# Patient Record
Sex: Male | Born: 1960 | Race: Black or African American | Hispanic: No | Marital: Married | Smoking: Former smoker
Health system: Southern US, Community
[De-identification: ages and names within clinical notes are randomized; demographics above are authoritative.]

## PROBLEM LIST (undated history)

## (undated) ENCOUNTER — Emergency Department (HOSPITAL_COMMUNITY): Discharge status: Absent without leave | Payer: Medicaid - Out of State

## (undated) DIAGNOSIS — I10 Essential (primary) hypertension: Secondary | ICD-10-CM

## (undated) DIAGNOSIS — G8929 Other chronic pain: Secondary | ICD-10-CM

## (undated) DIAGNOSIS — E119 Type 2 diabetes mellitus without complications: Secondary | ICD-10-CM

## (undated) DIAGNOSIS — M549 Dorsalgia, unspecified: Secondary | ICD-10-CM

## (undated) HISTORY — PX: UMBILICAL HERNIA REPAIR: SHX196

## (undated) HISTORY — PX: BACK SURGERY: SHX140

---

## 2012-02-20 ENCOUNTER — Emergency Department (HOSPITAL_COMMUNITY)
Admission: EM | Admit: 2012-02-20 | Discharge: 2012-02-21 | Disposition: A | Discharge status: Home / Self Care | Payer: Self-pay | Attending: Emergency Medicine | Admitting: Emergency Medicine

## 2012-02-20 ENCOUNTER — Emergency Department (HOSPITAL_COMMUNITY): Payer: Self-pay

## 2012-02-20 ENCOUNTER — Encounter (HOSPITAL_COMMUNITY): Payer: Self-pay | Admitting: Emergency Medicine

## 2012-02-20 DIAGNOSIS — M545 Low back pain, unspecified: Secondary | ICD-10-CM | POA: Insufficient documentation

## 2012-02-20 DIAGNOSIS — Z87891 Personal history of nicotine dependence: Secondary | ICD-10-CM | POA: Insufficient documentation

## 2012-02-20 DIAGNOSIS — R296 Repeated falls: Secondary | ICD-10-CM | POA: Insufficient documentation

## 2012-02-20 DIAGNOSIS — Y929 Unspecified place or not applicable: Secondary | ICD-10-CM | POA: Insufficient documentation

## 2012-02-20 DIAGNOSIS — S60229A Contusion of unspecified hand, initial encounter: Secondary | ICD-10-CM | POA: Insufficient documentation

## 2012-02-20 DIAGNOSIS — Y9389 Activity, other specified: Secondary | ICD-10-CM | POA: Insufficient documentation

## 2012-02-20 DIAGNOSIS — G8929 Other chronic pain: Secondary | ICD-10-CM | POA: Insufficient documentation

## 2012-02-20 HISTORY — DX: Other chronic pain: G89.29

## 2012-02-20 HISTORY — DX: Dorsalgia, unspecified: M54.9

## 2012-02-20 MED ORDER — CYCLOBENZAPRINE HCL 10 MG PO TABS
5.0000 mg | ORAL_TABLET | Freq: Once | ORAL | Status: AC
Start: 2012-02-20 — End: 2012-02-20
  Administered 2012-02-20: via ORAL
  Filled 2012-02-20: qty 1

## 2012-02-20 MED ORDER — HYDROCODONE-ACETAMINOPHEN 5-325 MG PO TABS
1.0000 | ORAL_TABLET | Freq: Once | ORAL | Status: AC
  Administered 2012-02-20: 1 via ORAL
  Filled 2012-02-20: qty 1

## 2012-02-20 MED ORDER — CYCLOBENZAPRINE HCL 5 MG PO TABS: 5.0000 mg | ORAL_TABLET | Freq: Once | ORAL | Status: DC

## 2012-02-20 MED ORDER — HYDROCODONE-ACETAMINOPHEN 5-325 MG PO TABS: 1.0000 | ORAL_TABLET | Freq: Once | ORAL | Status: DC

## 2012-02-20 NOTE — ED Notes (Signed)
Pt reports having chronic back pain; reports today was moving fridge and fell on his R hand; pt has swelling to R hand; CMS intact

## 2012-02-20 NOTE — ED Provider Notes (Signed)
Medical screening examination/treatment/procedure(s) were performed by non-physician practitioner and as supervising physician I was immediately available for consultation/collaboration.  Jasmine Awe, MD 02/20/12 [phone]

## 2012-02-20 NOTE — ED Provider Notes (Signed)
History     CSN: [id number]  Arrival date & time 02/20/12  4098   First MD Initiated Contact with Patient 02/20/12 2304      Chief Complaint  Patient presents with  . Hand Injury  . Back Pain    (Consider location/radiation/quality/duration/timing/severity/associated sxs/prior treatment) HPI Comments: Patient states he was unloading refrigerators from pallets require him to lay the refrigerator.  I decided to disconnect the palate.  He was working with a partner who later with his end of the refrigerator, causing him to fall onto his right hand jarring his back.  He has a history of previous back surgery with fusion of L2 through 5 approximately 1 year ago, performed at [facility] Has not taken any medication.  Prior to arrival.  It to to his allergy to tramadol and ibuprofen  Patient is a 51 y.o. male presenting with hand injury and back pain. The history is provided by the patient.  Hand Injury  The incident occurred 12 to 24 hours ago.  Back Pain  Pertinent negatives include no numbness and no weakness.    Past Medical History  Diagnosis Date  . Chronic back pain     Past Surgical History  Procedure Date  . Umbilical hernia repair   . Back surgery     No family history on file.  History  Substance Use Topics  . Smoking status: Former Smoker    Quit date: 02/20/1992  . Smokeless tobacco: Not on file  . Alcohol Use: No      Review of Systems  Gastrointestinal: Negative for nausea.  Musculoskeletal: Positive for back pain and joint swelling.  Neurological: Negative for dizziness, weakness and numbness.    Allergies  Motrin and Tramadol  Home Medications   Current Outpatient Rx  Name Route Sig Dispense Refill  . CYCLOBENZAPRINE HCL 5 MG PO TABS Oral Take 1 tablet (5 mg total) by mouth once. 30 tablet 0  . HYDROCODONE-ACETAMINOPHEN 5-325 MG PO TABS Oral Take 1 tablet by mouth once. 15 tablet 0    BP 163/83  Pulse 96  Temp 98.4 F  (36.9 C) (Oral)  Resp 18  SpO2 98%  Physical Exam  Constitutional: He is oriented to person, place, and time. He appears well-developed and well-nourished.  HENT:  Head: Normocephalic.  Eyes: Pupils are equal, round, and reactive to light.  Neck: Normal range of motion.  Cardiovascular: Normal rate.   Pulmonary/Chest: Effort normal.  Musculoskeletal: He exhibits edema and tenderness.       Minimal swelling to the dorsal aspect of his right hand.  Full range of motion of all digits  Neurological: He is alert and oriented to person, place, and time.  Skin: Skin is warm.    ED Course  Procedures (including critical care time)  Labs Reviewed - No data to display Dg Hand Complete Right  02/20/2012  *RADIOLOGY REPORT*  Clinical Data: Blunt trauma to hand.  Pain.  RIGHT HAND - COMPLETE 3+ VIEW  Comparison: None.  Findings: Soft tissue swelling is present over the dorsum of the hand at the MCP joints.  There is no underlying fracture. Irregularity at the base of the fifth metacarpal is compatible with a remote fracture.  IMPRESSION:  1.  Soft tissue swelling over the dorsum hand without an acute fracture. 2.  Deformity of the proximal fifth metacarpal is compatible with a remote fracture.   Original Report Authenticated By: Jamesetta Orleans. MATTERN, M.D.      1. Gilford Rile  contusion   2. Low back pain       MDM   X-ray reveals no fracture of the hand.  We'll treat with Ace bandage, pain control.  We'll treat his exacerbation of his back pain with several days of Vicodin, and Flexeril        Arman Filter, NP 02/20/12 2346

## 2012-02-27 ENCOUNTER — Emergency Department (HOSPITAL_COMMUNITY)
Admission: EM | Admit: 2012-02-27 | Discharge: 2012-02-27 | Disposition: A | Discharge status: Home / Self Care | Payer: Self-pay | Attending: Emergency Medicine | Admitting: Emergency Medicine

## 2012-02-27 ENCOUNTER — Encounter (HOSPITAL_COMMUNITY): Payer: Self-pay | Admitting: Emergency Medicine

## 2012-02-27 DIAGNOSIS — G8929 Other chronic pain: Secondary | ICD-10-CM | POA: Insufficient documentation

## 2012-02-27 DIAGNOSIS — T148XXA Other injury of unspecified body region, initial encounter: Secondary | ICD-10-CM

## 2012-02-27 DIAGNOSIS — M545 Low back pain: Secondary | ICD-10-CM

## 2012-02-27 DIAGNOSIS — Y9389 Activity, other specified: Secondary | ICD-10-CM | POA: Insufficient documentation

## 2012-02-27 DIAGNOSIS — S335XXA Sprain of ligaments of lumbar spine, initial encounter: Secondary | ICD-10-CM | POA: Insufficient documentation

## 2012-02-27 DIAGNOSIS — Z87891 Personal history of nicotine dependence: Secondary | ICD-10-CM | POA: Insufficient documentation

## 2012-02-27 DIAGNOSIS — X500XXA Overexertion from strenuous movement or load, initial encounter: Secondary | ICD-10-CM | POA: Insufficient documentation

## 2012-02-27 DIAGNOSIS — Y929 Unspecified place or not applicable: Secondary | ICD-10-CM | POA: Insufficient documentation

## 2012-02-27 DIAGNOSIS — Z9889 Other specified postprocedural states: Secondary | ICD-10-CM | POA: Insufficient documentation

## 2012-02-27 MED ORDER — HYDROCODONE-ACETAMINOPHEN 5-325 MG PO TABS: 1.0000 | ORAL_TABLET | Freq: Once | ORAL | Status: DC

## 2012-02-27 MED ORDER — CYCLOBENZAPRINE HCL 5 MG PO TABS: 5.0000 mg | ORAL_TABLET | Freq: Once | ORAL | Status: DC

## 2012-02-27 NOTE — ED Notes (Signed)
Pt states that he injured his back today lifting furniture.  Low back pain 10/10.

## 2012-02-27 NOTE — ED Provider Notes (Signed)
History     CSN: [id number]  Arrival date & time 02/27/12  1351   First MD Initiated Contact with Patient 02/27/12 1420      Chief Complaint  Patient presents with  . Back Pain    (Consider location/radiation/quality/duration/timing/severity/associated sxs/prior treatment) Patient is a 51 y.o. male presenting with back pain. The history is provided by the patient.  Back Pain  This is a new problem. The current episode started 3 to 5 hours ago. The problem occurs constantly. The problem has not changed since onset.The pain is associated with lifting heavy objects. Pertinent negatives include no fever and no numbness. Associated symptoms comments: He reports pain in lower back after heavy lifting earlier today. He has a history of lumbar surgery over one year ago and a back sprain injury on 02-20-12 that he feels resolved prior to today's injury. No leg pain, numbness or weakness..    Past Medical History  Diagnosis Date  . Chronic back pain     Past Surgical History  Procedure Date  . Umbilical hernia repair   . Back surgery     No family history on file.  History  Substance Use Topics  . Smoking status: Former Smoker    Quit date: 02/20/1992  . Smokeless tobacco: Not on file  . Alcohol Use: No      Review of Systems  Constitutional: Negative for fever and chills.  HENT: Negative.  Negative for neck pain.   Cardiovascular: Negative.   Gastrointestinal: Negative.   Musculoskeletal: Positive for back pain.       See HPI  Skin: Negative.   Neurological: Negative.  Negative for numbness.    Allergies  Motrin and Tramadol  Home Medications   Current Outpatient Rx  Name  Route  Sig  Dispense  Refill  . CYCLOBENZAPRINE HCL 5 MG PO TABS   Oral   Take 1 tablet (5 mg total) by mouth once.   30 tablet   0   . HYDROCODONE-ACETAMINOPHEN 5-325 MG PO TABS   Oral   Take 1 tablet by mouth once.   15 tablet   0   . ADULT MULTIVITAMIN W/MINERALS CH   Oral   Take  1 tablet by mouth daily.           BP 139/79  Pulse 94  Resp 18  SpO2 98%  Physical Exam  Constitutional: He is oriented to person, place, and time. He appears well-developed and well-nourished. No distress.  HENT:  Head: Normocephalic.  Neck: Normal range of motion.  Pulmonary/Chest: Effort normal.  Abdominal: There is no tenderness.  Musculoskeletal: Normal range of motion.       Paralumbar tenderness bilaterally. No swelling.  Neurological: He is alert and oriented to person, place, and time. He has normal reflexes.  Skin: Skin is warm and dry.  Psychiatric: He has a normal mood and affect.    ED Course  Procedures (including critical care time)  Labs Reviewed - No data to display No results found.   No diagnosis found.  1. Low back pain 2. Muscle strain  MDM  No neurologic deficits on exam. Supports muscular strain injury.         Rodena Medin, PA-C 02/27/12 1453

## 2012-02-28 NOTE — ED Provider Notes (Signed)
Medical screening examination/treatment/procedure(s) were performed by non-physician practitioner and as supervising physician I was immediately available for consultation/collaboration.   Ravinder Hofland E Evolett Somarriba, MD 02/28/12 0815 

## 2012-03-22 ENCOUNTER — Emergency Department (HOSPITAL_COMMUNITY): Payer: Self-pay

## 2012-03-22 ENCOUNTER — Encounter (HOSPITAL_COMMUNITY): Payer: Self-pay | Admitting: Emergency Medicine

## 2012-03-22 ENCOUNTER — Emergency Department (HOSPITAL_COMMUNITY)
Admission: EM | Admit: 2012-03-22 | Discharge: 2012-03-22 | Disposition: A | Discharge status: Home / Self Care | Payer: Self-pay | Attending: Emergency Medicine | Admitting: Emergency Medicine

## 2012-03-22 DIAGNOSIS — W11XXXA Fall on and from ladder, initial encounter: Secondary | ICD-10-CM | POA: Insufficient documentation

## 2012-03-22 DIAGNOSIS — Y9389 Activity, other specified: Secondary | ICD-10-CM | POA: Insufficient documentation

## 2012-03-22 DIAGNOSIS — Z9889 Other specified postprocedural states: Secondary | ICD-10-CM | POA: Insufficient documentation

## 2012-03-22 DIAGNOSIS — M549 Dorsalgia, unspecified: Secondary | ICD-10-CM

## 2012-03-22 DIAGNOSIS — S46909A Unspecified injury of unspecified muscle, fascia and tendon at shoulder and upper arm level, unspecified arm, initial encounter: Secondary | ICD-10-CM | POA: Insufficient documentation

## 2012-03-22 DIAGNOSIS — S4980XA Other specified injuries of shoulder and upper arm, unspecified arm, initial encounter: Secondary | ICD-10-CM | POA: Insufficient documentation

## 2012-03-22 DIAGNOSIS — Y929 Unspecified place or not applicable: Secondary | ICD-10-CM | POA: Insufficient documentation

## 2012-03-22 DIAGNOSIS — Z87891 Personal history of nicotine dependence: Secondary | ICD-10-CM | POA: Insufficient documentation

## 2012-03-22 DIAGNOSIS — IMO0002 Reserved for concepts with insufficient information to code with codable children: Secondary | ICD-10-CM | POA: Insufficient documentation

## 2012-03-22 DIAGNOSIS — M25519 Pain in unspecified shoulder: Secondary | ICD-10-CM

## 2012-03-22 MED ORDER — OXYCODONE-ACETAMINOPHEN 5-325 MG PO TABS
1.0000 | ORAL_TABLET | Freq: Once | ORAL | Status: AC
  Administered 2012-03-22: 1 via ORAL
  Filled 2012-03-22: qty 1

## 2012-03-22 MED ORDER — OXYCODONE-ACETAMINOPHEN 5-325 MG PO TABS: 1.0000 | ORAL_TABLET | Freq: Four times a day (QID) | ORAL | Status: DC | PRN

## 2012-03-22 NOTE — ED Notes (Signed)
Pt states that he was hanging christmas lights yesterday and fell off a ladder from about 5 ft.  C/o lt sided head/back pain.  10/10.  Pt is A&O x 4.

## 2012-03-22 NOTE — ED Provider Notes (Signed)
History     CSN: [id number]  Arrival date & time 03/22/12  1011   First MD Initiated Contact with Patient 03/22/12 1021      Chief Complaint  Patient presents with  . Fall  . Headache  . Back Pain    (Consider location/radiation/quality/duration/timing/severity/associated sxs/prior treatment) HPI Pt presents with c/o pain in right shoulder and right low back after fall from ladder last night while hanging christmas lights.  He states he also hit the right side of his head- no LOC, no vomiting, no seizure activity.  He does have chronic back pain and has had prior low back surgeries but states the pain had not been bothering him much until the fall yesterday.  No weakness of leg, no urinary retention, no incontinence of bowel or bladder.  Movement and palpation and certain positions make symptoms worse.  There are no other associated systemic symptoms, there are no other alleviating or modifying factors.   Past Medical History  Diagnosis Date  . Chronic back pain     Past Surgical History  Procedure Date  . Umbilical hernia repair   . Back surgery     No family history on file.  History  Substance Use Topics  . Smoking status: Former Smoker    Quit date: 02/20/1992  . Smokeless tobacco: Not on file  . Alcohol Use: No      Review of Systems ROS reviewed and all otherwise negative except for mentioned in HPI  Allergies  Motrin and Tramadol  Home Medications   Current Outpatient Rx  Name  Route  Sig  Dispense  Refill  . ADULT MULTIVITAMIN W/MINERALS CH   Oral   Take 1 tablet by mouth daily.         . OXYCODONE-ACETAMINOPHEN 5-325 MG PO TABS   Oral   Take 1-2 tablets by mouth every 6 (six) hours as needed for pain.   15 tablet   0     BP 139/90  Pulse 86  Temp 97.7 F (36.5 C) (Oral)  Resp 18  SpO2 100% Vitals reviewed Physical Exam Physical Examination: General appearance - alert, well appearing, and in no distress Mental status - alert,  oriented to person, place, and time Eyes - pupils equal and reactive, EOMI Mouth - mucous membranes moist, pharynx normal without lesions Chest - clear to auscultation, no wheezes, rales or rhonchi, symmetric air entry Heart - normal rate, regular rhythm, normal S1, S2, no murmurs, rubs, clicks or gallops Abdomen - soft, nontender, nondistended, no masses or organomegaly Back exam - mild lumbar tenderness, also right sided paraspinous tenderness, no midline cervical spine tenderness,  Musculoskeletal - ttp over anterior right shoulder, some pain with ROM of right shoulder, no joint tenderness, deformity or swelling Extremities - peripheral pulses normal, no pedal edema, no clubbing or cyanosis Skin - normal coloration and turgor, no rashes  ED Course  Procedures (including critical care time)  Labs Reviewed - No data to display Dg Lumbar Spine Complete  03/22/2012  *RADIOLOGY REPORT*  Clinical Data: Fall from ladder, back and shoulder pain  LUMBAR SPINE - COMPLETE 4+ VIEW  Comparison: None.  Findings: Previous posterior laminectomy and fusion at L5 S1. Normal alignment.  Preserved vertebral body heights and disc spaces.  No definite fracture, compression deformity or focal kyphosis.  No hardware abnormality.  Pedicles intact.  Normal SI joints.  Nonobstructive bowel gas pattern.  IMPRESSION: Previous posterior laminectomy and fusion at L5 S1.  No acute finding by plain  radiography   Original Report Authenticated By: Judie Petit. Miles Costain, M.D.    Dg Shoulder Right  03/22/2012  *RADIOLOGY REPORT*  Clinical Data: Fall, right shoulder pain  RIGHT SHOULDER - 2+ VIEW  Comparison: None.  Findings: 3 views of the right shoulder submitted.  No acute fracture or subluxation.  Minimal spurring of the greater humeral tuberosity.  Glenohumeral joint and AC joint are preserved.  IMPRESSION: No acute fracture or subluxation.   Original Report Authenticated By: Natasha Mead, M.D.      1. Back pain   2. Shoulder pain        MDM  Pt presents with c/o pain in low back and right shoulder pain after fall from ladder yesterday.  xrays reassuring.  Pt given percocet for pain control. Pt advised to be sure to f/u with PMD if symptoms persist.  Discharged with strict return precautions.  Pt agreeable with plan.        Ethelda Chick, MD 03/22/12 [phone]

## 2012-06-28 ENCOUNTER — Emergency Department (HOSPITAL_COMMUNITY)
Admission: EM | Admit: 2012-06-28 | Discharge: 2012-06-28 | Disposition: A | Discharge status: Home / Self Care | Payer: Self-pay | Attending: Emergency Medicine | Admitting: Emergency Medicine

## 2012-06-28 ENCOUNTER — Emergency Department (HOSPITAL_COMMUNITY): Payer: Self-pay

## 2012-06-28 ENCOUNTER — Encounter (HOSPITAL_COMMUNITY): Payer: Self-pay | Admitting: Emergency Medicine

## 2012-06-28 DIAGNOSIS — Z87891 Personal history of nicotine dependence: Secondary | ICD-10-CM | POA: Insufficient documentation

## 2012-06-28 DIAGNOSIS — Y92009 Unspecified place in unspecified non-institutional (private) residence as the place of occurrence of the external cause: Secondary | ICD-10-CM | POA: Insufficient documentation

## 2012-06-28 DIAGNOSIS — W010XXA Fall on same level from slipping, tripping and stumbling without subsequent striking against object, initial encounter: Secondary | ICD-10-CM | POA: Insufficient documentation

## 2012-06-28 DIAGNOSIS — Z79899 Other long term (current) drug therapy: Secondary | ICD-10-CM | POA: Insufficient documentation

## 2012-06-28 DIAGNOSIS — Y9329 Activity, other involving ice and snow: Secondary | ICD-10-CM | POA: Insufficient documentation

## 2012-06-28 DIAGNOSIS — IMO0002 Reserved for concepts with insufficient information to code with codable children: Secondary | ICD-10-CM | POA: Insufficient documentation

## 2012-06-28 MED ORDER — CYCLOBENZAPRINE HCL 10 MG PO TABS: 10.0000 mg | ORAL_TABLET | Freq: Two times a day (BID) | ORAL | Status: AC | PRN

## 2012-06-28 MED ORDER — ACETAMINOPHEN 500 MG PO TABS: 500.0000 mg | ORAL_TABLET | Freq: Four times a day (QID) | ORAL | Status: AC | PRN

## 2012-06-28 NOTE — ED Provider Notes (Signed)
History     CSN: [id number]  Arrival date & time 06/28/12  1035   First MD Initiated Contact with Patient 06/28/12 1105      Chief Complaint  Patient presents with  . Back Pain    (Consider location/radiation/quality/duration/timing/severity/associated sxs/prior treatment) HPI  52 year old male with history of chronic back pain presents complaining of low back injury. Patient reports he slipped on ice and fell backward last night hitting his tailbone and low back against a hard surface. He did hit his head secondary to his falling but denies any significant headache. However did endorse some mild tenderness to his posterior neck. He denies any loss of consciousness. He denies any other injury. He is currently complaining of sharp 10 out of 10 pain to his low back with movement. He is able to ambulate.  Has tried taking 2 advils and using heating pad without adequate relief.  Difficulty sleeping last night.  Denies any precipitating sxs prior to fall.  Denies radicular pain, new onset of numbness/weakness, urinary/bowel incontinence, or saddle paresthesia.    Past Medical History  Diagnosis Date  . Chronic back pain     Past Surgical History  Procedure Laterality Date  . Umbilical hernia repair    . Back surgery      History reviewed. No pertinent family history.  History  Substance Use Topics  . Smoking status: Former Smoker    Quit date: 02/20/1992  . Smokeless tobacco: Not on file  . Alcohol Use: No      Review of Systems  Constitutional: Negative for fever.  HENT: Positive for neck pain.   Musculoskeletal: Positive for back pain.  Skin: Negative for wound.  Neurological: Negative for headaches.    Allergies  Motrin and Tramadol  Home Medications   Current Outpatient Rx  Name  Route  Sig  Dispense  Refill  . hydrochlorothiazide (HYDRODIURIL) 25 MG tablet   Oral   Take 25 mg by mouth daily.           BP 128/84  Pulse 81  Temp(Src) 98.9 F (37.2 C)  (Oral)  Resp 20  SpO2 98%  Physical Exam  Nursing note and vitals reviewed. Constitutional: He is oriented to person, place, and time. He appears well-developed and well-nourished. No distress.  HENT:  Head: Normocephalic and atraumatic.  Eyes: Conjunctivae are normal.  Neck: Normal range of motion. Neck supple.  Abdominal: There is no tenderness.  Musculoskeletal: He exhibits tenderness (no significant midline cervical tenderness, FROM.  tenderness to lumbar and paralumbar region on exam, no step off, crepitus, or overlying skin changes). He exhibits no edema.  Neurological: He is alert and oriented to person, place, and time.  Skin: No rash noted.    ED Course  Procedures (including critical care time)  Labs Reviewed - No data to display Dg Cervical Spine Complete  06/28/2012  *RADIOLOGY REPORT*  Clinical Data: Fall, generalized neck/back pain  CERVICAL SPINE - COMPLETE 4+ VIEW  Comparison: None.  Findings: Cervical spine is visualized to C7-T1 on the lateral view.  Mild straightening of the cervical spine.  No evidence of fracture or dislocation.  Vertebral body heights are maintained.  The dens appears intact.  Lateral masses of C1 are symmetric.  No prevertebral soft tissue swelling.  Mild multilevel degenerative changes.  Mild narrowing of the bilateral neural foramen at C7-T1.  IMPRESSION: No fracture or dislocation is seen.  Mild multilevel degenerative changes.  Mild narrowing of the bilateral neural foramen at C7-T1.  Original Report Authenticated By: Charline Bills, M.D.    Dg Lumbar Spine Complete  06/28/2012  *RADIOLOGY REPORT*  Clinical Data: Fall, back pain  LUMBAR SPINE - COMPLETE 4+ VIEW  Comparison: 03/22/2012  Findings: Previous L5-1 posterior fusion and laminectomy.  Hardware appears intact.  Stable alignment.  No compression fracture, wedge shaped deformity or focal kyphosis.  Normal SI joints. Nonobstructive bowel gas pattern.  IMPRESSION: Stable L5 S1 posterior  fusion.  No interval change or acute process.   Original Report Authenticated By: Judie Petit. Miles Costain, M.D.      No diagnosis found.  1. Lower back injury  BP 128/84  Pulse 81  Temp(Src) 98.9 F (37.2 C) (Oral)  Resp 20  SpO2 98%  I have reviewed nursing notes and vital signs. I personally reviewed the imaging tests through PACS system  I reviewed available ER/hospitalization records thought the EMR   MDM          Fayrene Helper, PA-C 06/28/12 1229

## 2012-06-28 NOTE — ED Notes (Signed)
Pt c/o of lower back pain 10/10. States he slipped and fell on ice last night while rushing out the door.

## 2012-06-28 NOTE — ED Provider Notes (Signed)
Medical screening examination/treatment/procedure(s) were performed by non-physician practitioner and as supervising physician I was immediately available for consultation/collaboration.    Vida Roller, MD 06/28/12 1536

## 2013-01-09 IMAGING — CR DG LUMBAR SPINE COMPLETE 4+V
5 series · 5 of 5 positions shown · non-contrast
Comparison: None.

CLINICAL DATA: Fall from ladder, back and shoulder pain

LUMBAR SPINE - COMPLETE 4+ VIEW

[t lumbar spine ap]
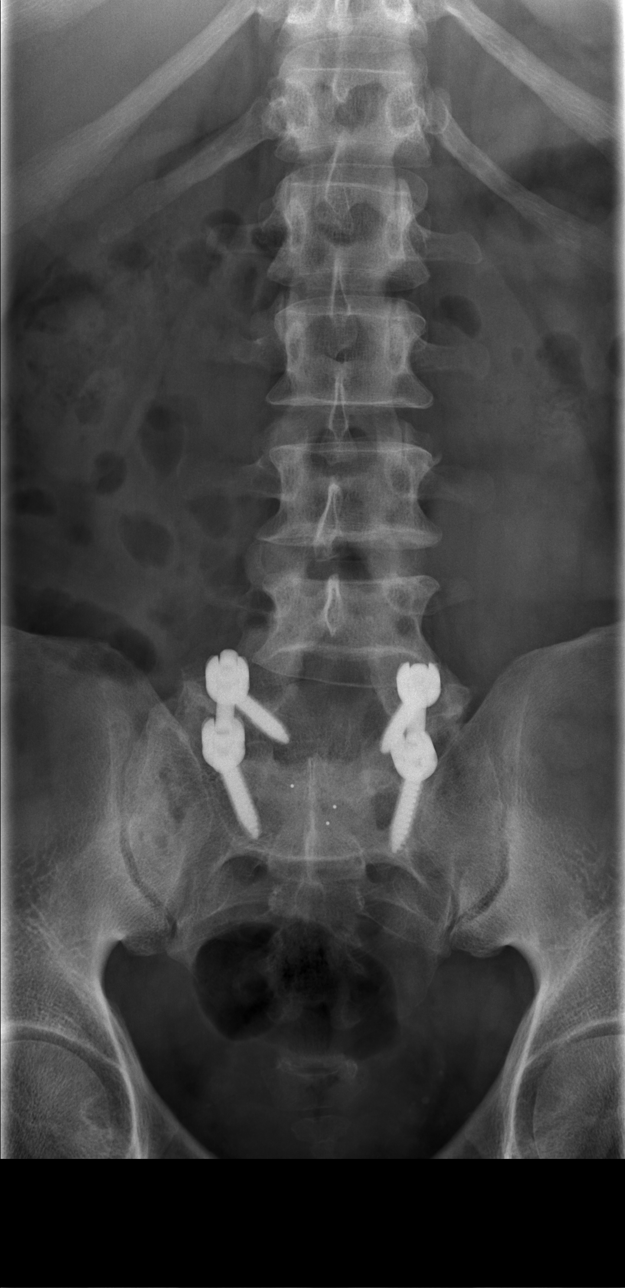

[t lumbar spine obl (1 of 2)]
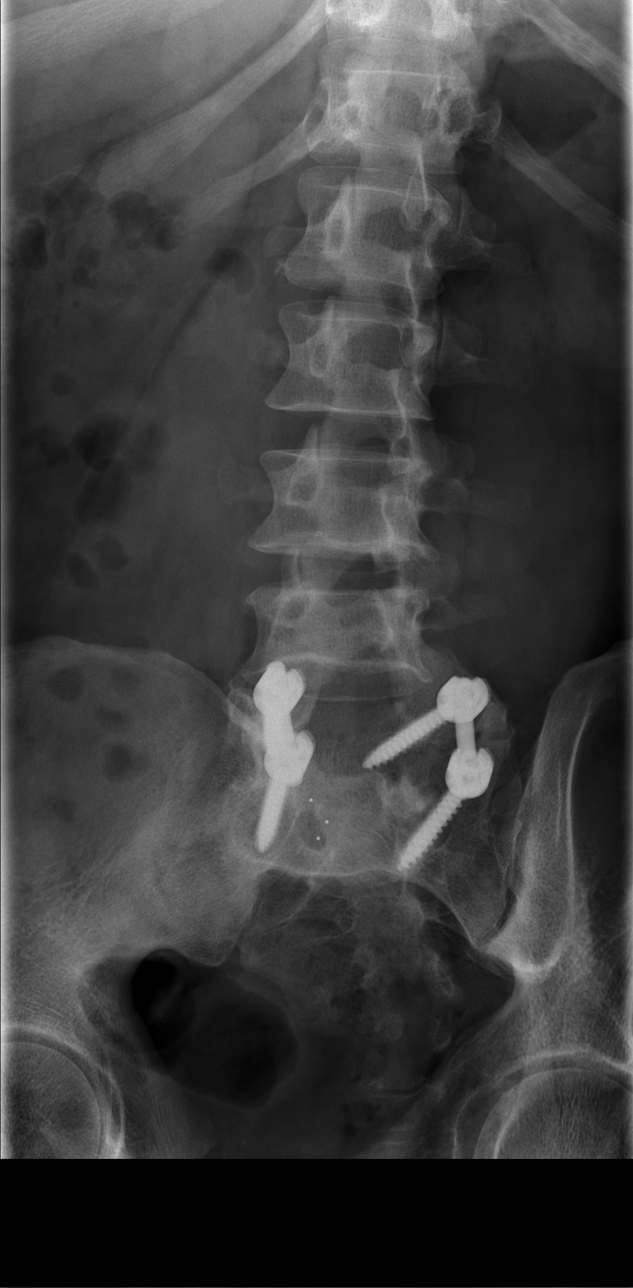

[t lumbar spine obl (2 of 2)]
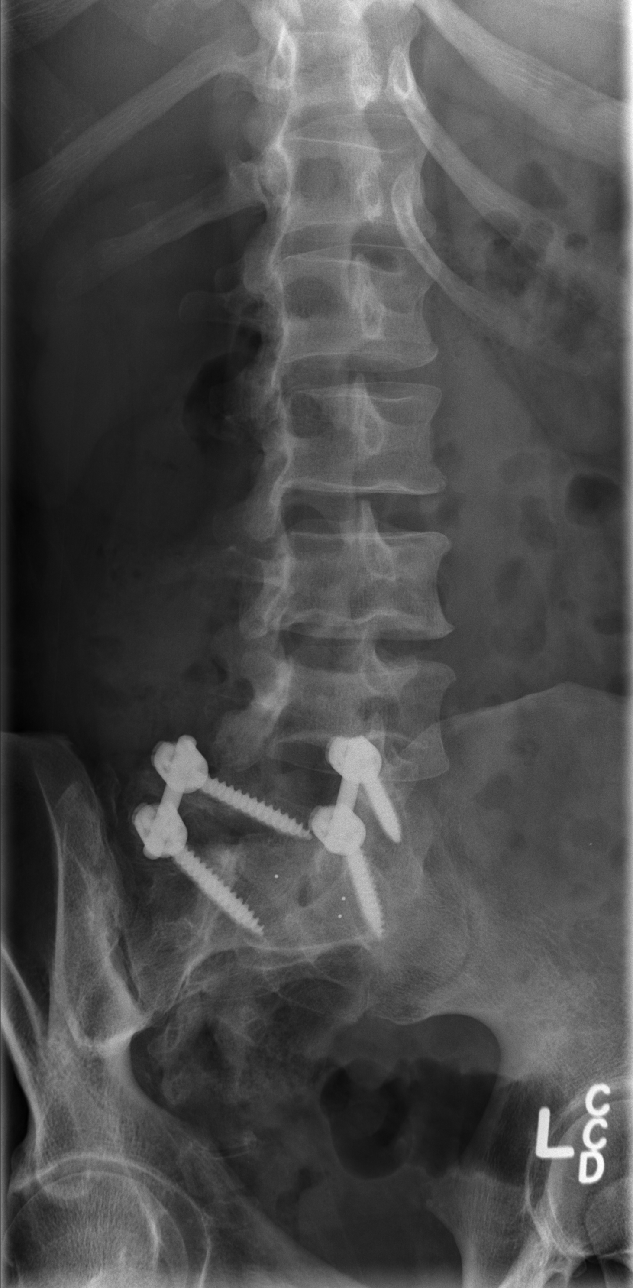

[t lumbar spine lat]
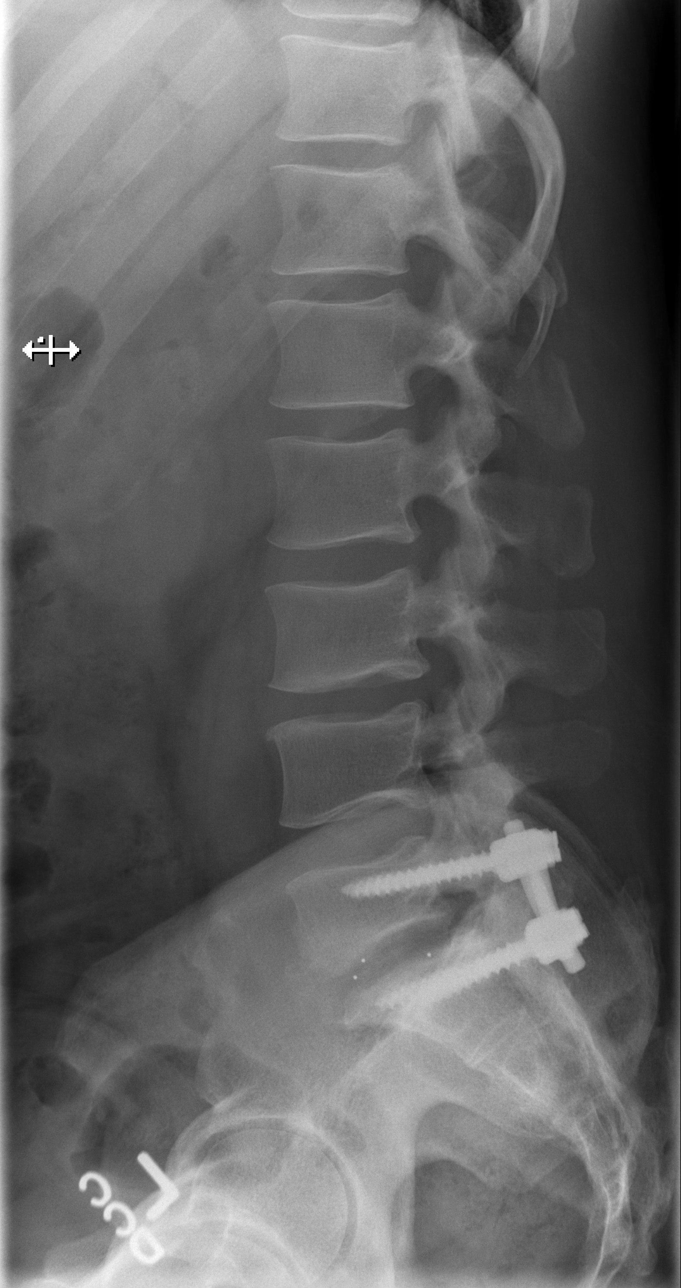

[t lumbar l-5 s-1 spot]
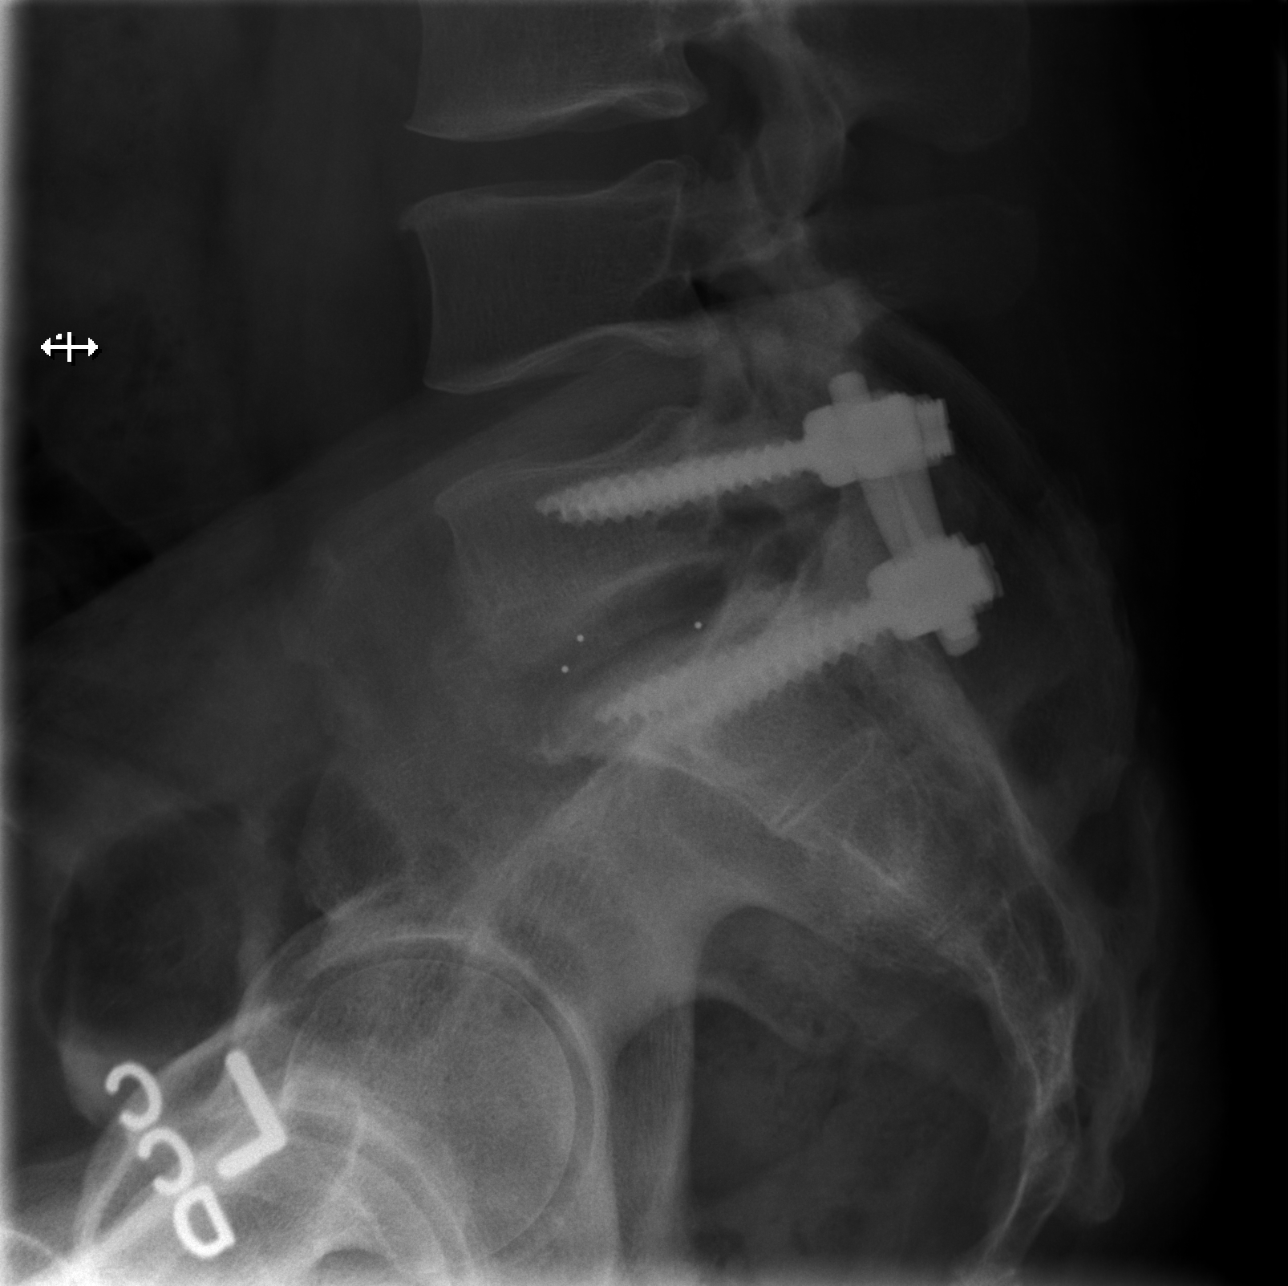

[5 of 5 positions shown; findings below may reference images not displayed]

FINDINGS: Previous posterior laminectomy and fusion at L5 S1.
Normal alignment.  Preserved vertebral body heights and disc
spaces.  No definite fracture, compression deformity or focal
kyphosis.  No hardware abnormality.  Pedicles intact.  Normal SI
joints.  Nonobstructive bowel gas pattern.
IMPRESSION: Previous posterior laminectomy and fusion at L5 S1.  No acute
finding by plain radiography

## 2015-02-09 ENCOUNTER — Encounter (HOSPITAL_COMMUNITY): Payer: Self-pay | Admitting: Emergency Medicine

## 2015-02-09 ENCOUNTER — Emergency Department (HOSPITAL_COMMUNITY)
Admission: EM | Admit: 2015-02-09 | Discharge: 2015-02-09 | Disposition: A | Discharge status: Home / Self Care | Payer: Medicaid - Out of State | Attending: Emergency Medicine | Admitting: Emergency Medicine

## 2015-02-09 ENCOUNTER — Emergency Department (HOSPITAL_COMMUNITY): Payer: Medicaid - Out of State

## 2015-02-09 DIAGNOSIS — G8929 Other chronic pain: Secondary | ICD-10-CM | POA: Insufficient documentation

## 2015-02-09 DIAGNOSIS — Y998 Other external cause status: Secondary | ICD-10-CM | POA: Diagnosis not present

## 2015-02-09 DIAGNOSIS — S99912A Unspecified injury of left ankle, initial encounter: Secondary | ICD-10-CM | POA: Diagnosis present

## 2015-02-09 DIAGNOSIS — Y9289 Other specified places as the place of occurrence of the external cause: Secondary | ICD-10-CM | POA: Insufficient documentation

## 2015-02-09 DIAGNOSIS — X58XXXA Exposure to other specified factors, initial encounter: Secondary | ICD-10-CM | POA: Insufficient documentation

## 2015-02-09 DIAGNOSIS — Z87891 Personal history of nicotine dependence: Secondary | ICD-10-CM | POA: Diagnosis not present

## 2015-02-09 DIAGNOSIS — Y9389 Activity, other specified: Secondary | ICD-10-CM | POA: Insufficient documentation

## 2015-02-09 DIAGNOSIS — Z79899 Other long term (current) drug therapy: Secondary | ICD-10-CM | POA: Insufficient documentation

## 2015-02-09 DIAGNOSIS — S93402A Sprain of unspecified ligament of left ankle, initial encounter: Secondary | ICD-10-CM | POA: Insufficient documentation

## 2015-02-09 MED ORDER — ACETAMINOPHEN 325 MG PO TABS
650.0000 mg | ORAL_TABLET | Freq: Once | ORAL | Status: AC
  Administered 2015-02-09: 650 mg via ORAL
  Filled 2015-02-09: qty 2

## 2015-02-09 NOTE — ED Provider Notes (Signed)
CSN: [id number]     Arrival date & time 02/09/15  1808 History   First MD Initiated Contact with Patient 02/09/15 1937     Chief Complaint  Patient presents with  . Ankle Pain     (Consider location/radiation/quality/duration/timing/severity/associated sxs/prior Treatment) Patient is a 54 y.o. male presenting with ankle pain. The history is provided by the patient.  Ankle Pain Associated symptoms: no fever   Patient indicates mis-stepped, twisting injury to left ankle shortly prior to ED arrival.  C/o pain diffusely in ankle. Constant. Dull. Moderate. Worse w wt bearing. No knee pain. No foot pain. No numbness/weakness. Skin intact.      Past Medical History  Diagnosis Date  . Chronic back pain    Past Surgical History  Procedure Laterality Date  . Umbilical hernia repair    . Back surgery     No family history on file. Social History  Substance Use Topics  . Smoking status: Former Smoker    Quit date: 02/20/1992  . Smokeless tobacco: None  . Alcohol Use: No    Review of Systems  Constitutional: Negative for fever.  Skin: Negative for wound.  Neurological: Negative for numbness.      Allergies  Motrin and Tramadol  Home Medications   Prior to Admission medications   Medication Sig Start Date End Date Taking? Authorizing Provider  acetaminophen (TYLENOL) 500 MG tablet Take 1 tablet (500 mg total) by mouth every 6 (six) hours as needed for pain. 06/28/12   Fayrene HelperBowie Tran, PA-C  cyclobenzaprine (FLEXERIL) 10 MG tablet Take 1 tablet (10 mg total) by mouth 2 (two) times daily as needed for muscle spasms. 06/28/12   Fayrene HelperBowie Tran, PA-C  hydrochlorothiazide (HYDRODIURIL) 25 MG tablet Take 25 mg by mouth daily.    Historical Provider, MD   BP 141/89 mmHg  Pulse 89  Temp(Src) 98.1 F (36.7 C) (Oral)  Resp 20  SpO2 99% Physical Exam  Constitutional: He is oriented to person, place, and time. He appears well-developed and well-nourished. No distress.  HENT:  Head:  Atraumatic.  Eyes: No scleral icterus.  Neck: Neck supple. No tracheal deviation present.  Cardiovascular: Normal rate and intact distal pulses.   Pulmonary/Chest: Effort normal. No accessory muscle usage. No respiratory distress.  Musculoskeletal:  Minimal swelling to ankle. No focal malleolar tenderness. No deformity. No tenderness prox 5th mt.  Dp/pt 2+. Normal cap refill distally. Achilles intact. No pain/tenderness in region prox tib fib or knee.   Neurological: He is alert and oriented to person, place, and time.  Skin: Skin is warm and dry.  Psychiatric: He has a normal mood and affect.  Nursing note and vitals reviewed.   ED Course  Procedures (including critical care time)  Dg Ankle Complete Left  02/09/2015  CLINICAL DATA:  Medial and posterior left ankle pain after injury on stairs today. Initial encounter. EXAM: LEFT ANKLE COMPLETE - 3+ VIEW COMPARISON:  None currently available FINDINGS: There is no evidence of fracture, dislocation, or joint effusion. IMPRESSION: Negative. Electronically Signed   By: Marnee SpringJonathon  Watts M.D.   On: 02/09/2015 18:56   I have personally reviewed and evaluated these images and lab results as part of my medical decision-making.    MDM   Xrays.  xrays neg acute.  ASO brace, crutches, tylenol po.   Pt appears stable for d/c.   Discussed xrays/plan w pt.      Cathren LaineKevin Phat Dalton, MD 02/09/15 2005

## 2015-02-09 NOTE — ED Notes (Signed)
Pt states that he twisted his lt ankle 30 mins ago.  Ambulatory.

## 2015-02-09 NOTE — Discharge Instructions (Signed)
It was our pleasure to provide your ER care today - we hope that you feel better.  You may wear ASO brace and use crutches as need for the next few days.  Elevate ankle. Ice/coldpack to sore area.  Take tylenol as need for pain.  Follow up with primary care doctor in 1 week if symptoms fail to improve/resolve.  Return to ER if worse, new symptoms, medical emergency, other concern.     Ankle Sprain An ankle sprain is an injury to the strong, fibrous tissues (ligaments) that hold the bones of your ankle joint together.  CAUSES An ankle sprain is usually caused by a fall or by twisting your ankle. Ankle sprains most commonly occur when you step on the outer edge of your foot, and your ankle turns inward. People who participate in sports are more prone to these types of injuries.  SYMPTOMS   Pain in your ankle. The pain may be present at rest or only when you are trying to stand or walk.  Swelling.  Bruising. Bruising may develop immediately or within 1 to 2 days after your injury.  Difficulty standing or walking, particularly when turning corners or changing directions. DIAGNOSIS  Your caregiver will ask you details about your injury and perform a physical exam of your ankle to determine if you have an ankle sprain. During the physical exam, your caregiver will press on and apply pressure to specific areas of your foot and ankle. Your caregiver will try to move your ankle in certain ways. An X-ray exam may be done to be sure a bone was not broken or a ligament did not separate from one of the bones in your ankle (avulsion fracture).  TREATMENT  Certain types of braces can help stabilize your ankle. Your caregiver can make a recommendation for this. Your caregiver may recommend the use of medicine for pain. If your sprain is severe, your caregiver may refer you to a surgeon who helps to restore function to parts of your skeletal system (orthopedist) or a physical therapist. HOME CARE  INSTRUCTIONS  1. Apply ice to your injury for 1-2 days or as directed by your caregiver. Applying ice helps to reduce inflammation and pain. 1. Put ice in a plastic bag. 2. Place a towel between your skin and the bag. 3. Leave the ice on for 15-20 minutes at a time, every 2 hours while you are awake. 2. Only take over-the-counter or prescription medicines for pain, discomfort, or fever as directed by your caregiver. 3. Elevate your injured ankle above the level of your heart as much as possible for 2-3 days. 4. If your caregiver recommends crutches, use them as instructed. Gradually put weight on the affected ankle. Continue to use crutches or a cane until you can walk without feeling pain in your ankle. 5. If you have a plaster splint, wear the splint as directed by your caregiver. Do not rest it on anything harder than a pillow for the first 24 hours. Do not put weight on it. Do not get it wet. You may take it off to take a shower or bath. 6. You may have been given an elastic bandage to wear around your ankle to provide support. If the elastic bandage is too tight (you have numbness or tingling in your foot or your foot becomes cold and blue), adjust the bandage to make it comfortable. 7. If you have an air splint, you may blow more air into it or let air out to  make it more comfortable. You may take your splint off at night and before taking a shower or bath. Wiggle your toes in the splint several times per day to decrease swelling. SEEK MEDICAL CARE IF:  1. You have rapidly increasing bruising or swelling. 2. Your toes feel extremely cold or you lose feeling in your foot. 3. Your pain is not relieved with medicine. SEEK IMMEDIATE MEDICAL CARE IF: 1. Your toes are numb or blue. 2. You have severe pain that is increasing. MAKE SURE YOU:  1. Understand these instructions. 2. Will watch your condition. 3. Will get help right away if you are not doing well or get worse.   This information is  not intended to replace advice given to you by your health care provider. Make sure you discuss any questions you have with your health care provider.   Document Released: 04/12/2005 Document Revised: 05/03/2014 Document Reviewed: 04/24/2011 Elsevier Interactive Patient Education 2016 [employer] Inc[employer]     Cryotherapy Cryotherapy means treatment with cold. Ice or gel packs can be used to reduce both pain and swelling. Ice is the most helpful within the first 24 to 48 hours after an injury or flare-up from overusing a muscle or joint. Sprains, strains, spasms, burning pain, shooting pain, and aches can all be eased with ice. Ice can also be used when recovering from surgery. Ice is effective, has very few side effects, and is safe for most people to use. PRECAUTIONS  Ice is not a safe treatment option for people with:  Raynaud phenomenon. This is a condition affecting small blood vessels in the extremities. Exposure to cold may cause your problems to return.  Cold hypersensitivity. There are many forms of cold hypersensitivity, including:  Cold urticaria. Red, itchy hives appear on the skin when the tissues begin to warm after being iced.  Cold erythema. This is a red, itchy rash caused by exposure to cold.  Cold hemoglobinuria. Red blood cells break down when the tissues begin to warm after being iced. The hemoglobin that carry oxygen are passed into the urine because they cannot combine with blood proteins fast enough.  Numbness or altered sensitivity in the area being iced. If you have any of the following conditions, do not use ice until you have discussed cryotherapy with your caregiver: 8. Heart conditions, such as arrhythmia, angina, or chronic heart disease. 9. High blood pressure. 10. Healing wounds or open skin in the area being iced. 11. Current infections. 12. Rheumatoid arthritis. 13. Poor circulation. 14. Diabetes. Ice slows the blood flow in the region it is applied. This  is beneficial when trying to stop inflamed tissues from spreading irritating chemicals to surrounding tissues. However, if you expose your skin to cold temperatures for too long or without the proper protection, you can damage your skin or nerves. Watch for signs of skin damage due to cold. HOME CARE INSTRUCTIONS Follow these tips to use ice and cold packs safely. 4. Place a dry or damp towel between the ice and skin. A damp towel will cool the skin more quickly, so you may need to shorten the time that the ice is used. 5. For a more rapid response, add gentle compression to the ice. 6. Ice for no more than 10 to 20 minutes at a time. The bonier the area you are icing, the less time it will take to get the benefits of ice. 7. Check your skin after 5 minutes to make sure there are no signs of a  poor response to cold or skin damage. 8. Rest 20 minutes or more between uses. 9. Once your skin is numb, you can end your treatment. You can test numbness by very lightly touching your skin. The touch should be so light that you do not see the skin dimple from the pressure of your fingertip. When using ice, most people will feel these normal sensations in this order: cold, burning, aching, and numbness. 10. Do not use ice on someone who cannot communicate their responses to pain, such as small children or people with dementia. HOW TO MAKE AN ICE PACK Ice packs are the most common way to use ice therapy. Other methods include ice massage, ice baths, and cryosprays. Muscle creams that cause a cold, tingly feeling do not offer the same benefits that ice offers and should not be used as a substitute unless recommended by your caregiver. To make an ice pack, do one of the following: 3. Place crushed ice or a bag of frozen vegetables in a sealable plastic bag. Squeeze out the excess air. Place this bag inside another plastic bag. Slide the bag into a pillowcase or place a damp towel between your skin and the  bag. 4. Mix 3 parts water with 1 part rubbing alcohol. Freeze the mixture in a sealable plastic bag. When you remove the mixture from the freezer, it will be slushy. Squeeze out the excess air. Place this bag inside another plastic bag. Slide the bag into a pillowcase or place a damp towel between your skin and the bag. SEEK MEDICAL CARE IF: 4. You develop white spots on your skin. This may give the skin a blotchy (mottled) appearance. 5. Your skin turns blue or pale. 6. Your skin becomes waxy or hard. 7. Your swelling gets worse. MAKE SURE YOU:  1. Understand these instructions. 2. Will watch your condition. 3. Will get help right away if you are not doing well or get worse.   This information is not intended to replace advice given to you by your health care provider. Make sure you discuss any questions you have with your health care provider.   Document Released: 12/07/2010 Document Revised: 05/03/2014 Document Reviewed: 12/07/2010 Elsevier Interactive Patient Education 2016 [employer]    Crutch Use Crutches are used to take weight off one of your legs or feet when you stand or walk. It is important to use crutches that fit properly. When fitted properly:  Each crutch should be 2-3 finger widths below the armpit.  Your weight should be supported by your hand, and not by resting the armpit on the crutch. RISKS AND COMPLICATIONS Damage to the nerves that extend from your armpit to your hand and arm. To prevent this from happening, make sure your crutches fit properly and do not put pressure on your armpit when using them. HOW TO USE YOUR CRUTCHES If you have been instructed to use partial weight bearing, apply (bear) the amount of weight as your health care provider suggests. Do not bear weight in an amount that causes pain to the area of injury. Walking 15. Step with the crutches. 16. Swing the healthy leg slightly ahead of the crutches. Going Up Steps If there is no  handrail: 11. Step up with the healthy leg. 12. Step up with the crutches and injured leg. 13. Continue in this way. If there is a handrail: 5. Hold both crutches in one hand. 6. Place your free hand on the handrail. 7. While putting your weight on  your arms, lift your healthy leg to the step. 8. Bring the crutches and the injured leg up to that step. 9. Continue in this way. Going Down Steps Be very careful, as going down stairs with crutches is very challenging. If there is no handrail: 8. Step down with the injured leg and crutches. 9. Step down with the healthy leg. If there is a handrail: 4. Place your hand on the handrail. 5. Hold both crutches with your free hand. 6. Lower your injured leg and crutch to the step below you. Make sure to keep the crutch tips in the center of the step, never on the edge. 7. Lower your healthy leg to that step. 8. Continue in this way. Standing Up 1. Hold the injured leg forward. 2. Grab the armrest with one hand and the top of the crutches with the other hand. 3. Using these supports, pull yourself up to a standing position. Sitting Down 1. Hold the injured leg forward. 2. Grab the armrest with one hand and the top of the crutches with the other hand. 3. Lower yourself to a sitting position. SEEK MEDICAL CARE IF:  You still feel unsteady on your feet.  You develop new pain, for example in your armpits, back, shoulder, wrist, or hip.  You develop any numbness or tingling. SEEK IMMEDIATE MEDICAL CARE IF:  You fall.   This information is not intended to replace advice given to you by your health care provider. Make sure you discuss any questions you have with your health care provider.   Document Released: 04/09/2000 Document Revised: 05/03/2014 Document Reviewed: 12/18/2012 Elsevier Interactive Patient Education [employer]

## 2015-03-14 ENCOUNTER — Encounter (HOSPITAL_COMMUNITY): Payer: Self-pay | Admitting: Nurse Practitioner

## 2015-03-14 ENCOUNTER — Emergency Department (HOSPITAL_COMMUNITY)
Admission: EM | Admit: 2015-03-14 | Discharge: 2015-03-14 | Disposition: A | Discharge status: Home / Self Care | Payer: 59 | Attending: Emergency Medicine | Admitting: Emergency Medicine

## 2015-03-14 DIAGNOSIS — W260XXA Contact with knife, initial encounter: Secondary | ICD-10-CM | POA: Diagnosis not present

## 2015-03-14 DIAGNOSIS — Z79899 Other long term (current) drug therapy: Secondary | ICD-10-CM | POA: Insufficient documentation

## 2015-03-14 DIAGNOSIS — Y9389 Activity, other specified: Secondary | ICD-10-CM | POA: Insufficient documentation

## 2015-03-14 DIAGNOSIS — Z87891 Personal history of nicotine dependence: Secondary | ICD-10-CM | POA: Diagnosis not present

## 2015-03-14 DIAGNOSIS — Y9289 Other specified places as the place of occurrence of the external cause: Secondary | ICD-10-CM | POA: Insufficient documentation

## 2015-03-14 DIAGNOSIS — G8929 Other chronic pain: Secondary | ICD-10-CM | POA: Diagnosis not present

## 2015-03-14 DIAGNOSIS — S6992XA Unspecified injury of left wrist, hand and finger(s), initial encounter: Secondary | ICD-10-CM | POA: Diagnosis present

## 2015-03-14 DIAGNOSIS — S61012A Laceration without foreign body of left thumb without damage to nail, initial encounter: Secondary | ICD-10-CM | POA: Diagnosis not present

## 2015-03-14 DIAGNOSIS — Z23 Encounter for immunization: Secondary | ICD-10-CM | POA: Insufficient documentation

## 2015-03-14 DIAGNOSIS — Y998 Other external cause status: Secondary | ICD-10-CM | POA: Diagnosis not present

## 2015-03-14 HISTORY — DX: Essential (primary) hypertension: I10

## 2015-03-14 MED ORDER — TETANUS-DIPHTH-ACELL PERTUSSIS 5-2.5-18.5 LF-MCG/0.5 IM SUSP
0.5000 mL | Freq: Once | INTRAMUSCULAR | Status: AC
  Administered 2015-03-14: 0.5 mL via INTRAMUSCULAR
  Filled 2015-03-14: qty 0.5

## 2015-03-14 MED ORDER — ACETAMINOPHEN 325 MG PO TABS
650.0000 mg | ORAL_TABLET | Freq: Once | ORAL | Status: AC
  Administered 2015-03-14: 650 mg via ORAL
  Filled 2015-03-14: qty 2

## 2015-03-14 NOTE — Discharge Instructions (Signed)
Laceration Care, Adult  A laceration is a cut that goes through all layers of the skin. The cut also goes into the tissue that is right under the skin. Some cuts heal on their own. Others need to be closed with stitches (sutures), staples, skin adhesive strips, or wound glue. Taking care of your cut lowers your risk of infection and helps your cut to heal better.  HOW TO TAKE CARE OF YOUR CUT  For stitches or staples:  · Keep the wound clean and dry.  · If you were given a bandage (dressing), you should change it at least one time per day or as told by your doctor. You should also change it if it gets wet or dirty.  · Keep the wound completely dry for the first 24 hours or as told by your doctor. After that time, you may take a shower or a bath. However, make sure that the wound is not soaked in water until after the stitches or staples have been removed.  · Clean the wound one time each day or as told by your doctor:    Wash the wound with soap and water.    Rinse the wound with water until all of the soap comes off.    Pat the wound dry with a clean towel. Do not rub the wound.  · After you clean the wound, put a thin layer of antibiotic ointment on it as told by your doctor. This ointment:    Helps to prevent infection.    Keeps the bandage from sticking to the wound.  · Have your stitches or staples removed as told by your doctor.  If your doctor used skin adhesive strips:   · Keep the wound clean and dry.  · If you were given a bandage, you should change it at least one time per day or as told by your doctor. You should also change it if it gets dirty or wet.  · Do not get the skin adhesive strips wet. You can take a shower or a bath, but be careful to keep the wound dry.  · If the wound gets wet, pat it dry with a clean towel. Do not rub the wound.  · Skin adhesive strips fall off on their own. You can trim the strips as the wound heals. Do not remove any strips that are still stuck to the wound. They will  fall off after a while.  If your doctor used wound glue:  · Try to keep your wound dry, but you may briefly wet it in the shower or bath. Do not soak the wound in water, such as by swimming.  · After you take a shower or a bath, gently pat the wound dry with a clean towel. Do not rub the wound.  · Do not do any activities that will make you really sweaty until the skin glue has fallen off on its own.  · Do not apply liquid, cream, or ointment medicine to your wound while the skin glue is still on.  · If you were given a bandage, you should change it at least one time per day or as told by your doctor. You should also change it if it gets dirty or wet.  · If a bandage is placed over the wound, do not let the tape for the bandage touch the skin glue.  · Do not pick at the glue. The skin glue usually stays on for 5-10 days. Then, it   falls off of the skin.  General Instructions   · To help prevent scarring, make sure to cover your wound with sunscreen whenever you are outside after stitches are removed, after adhesive strips are removed, or when wound glue stays in place and the wound is healed. Make sure to wear a sunscreen of at least 30 SPF.  · Take over-the-counter and prescription medicines only as told by your doctor.  · If you were given antibiotic medicine or ointment, take or apply it as told by your doctor. Do not stop using the antibiotic even if your wound is getting better.  · Do not scratch or pick at the wound.  · Keep all follow-up visits as told by your doctor. This is important.  · Check your wound every day for signs of infection. Watch for:    Redness, swelling, or pain.    Fluid, blood, or pus.  · Raise (elevate) the injured area above the level of your heart while you are sitting or lying down, if possible.  GET HELP IF:  · You got a tetanus shot and you have any of these problems at the injection site:    Swelling.    Very bad pain.    Redness.    Bleeding.  · You have a fever.  · A wound that was  closed breaks open.  · You notice a bad smell coming from your wound or your bandage.  · You notice something coming out of the wound, such as wood or glass.  · Medicine does not help your pain.  · You have more redness, swelling, or pain at the site of your wound.  · You have fluid, blood, or pus coming from your wound.  · You notice a change in the color of your skin near your wound.  · You need to change the bandage often because fluid, blood, or pus is coming from the wound.  · You start to have a new rash.  · You start to have numbness around the wound.  GET HELP RIGHT AWAY IF:  · You have very bad swelling around the wound.  · Your pain suddenly gets worse and is very bad.  · You notice painful lumps near the wound or on skin that is anywhere on your body.  · You have a red streak going away from your wound.  · The wound is on your hand or foot and you cannot move a finger or toe like you usually can.  · The wound is on your hand or foot and you notice that your fingers or toes look pale or bluish.     This information is not intended to replace advice given to you by your health care provider. Make sure you discuss any questions you have with your health care provider.     Document Released: 09/29/2007 Document Revised: 08/27/2014 Document Reviewed: 04/08/2014  Elsevier Interactive Patient Education ©2016 Elsevier Inc.

## 2015-03-14 NOTE — ED Provider Notes (Signed)
CSN: [id number]     Arrival date & time 03/14/15  1814 History  By signing my name below, I, Evon Slack, attest that this documentation has been prepared under the direction and in the presence of Teressa Lower, NP. Electronically Signed: Evon Slack, ED Scribe. 03/14/2015. 6:30 PM.      Chief Complaint  Patient presents with  . Laceration    Patient is a 54 y.o. male presenting with skin laceration. The history is provided by the patient. No language interpreter was used.  Laceration  HPI Comments: Douglas Owens is a 54 y.o. male who presents to the Emergency Department complaining of left thumb laceration onset PTA. Pt rates the severity of pain 9/10. Pt states that he accidentally cut him self with a dull knife. Pt states that the bleeding is controlled. Pt doesn't report any numbness or tingling. Pt states that he is unsure of his tetanus status.   Past Medical History  Diagnosis Date  . Chronic back pain    Past Surgical History  Procedure Laterality Date  . Umbilical hernia repair    . Back surgery     No family history on file. Social History  Substance Use Topics  . Smoking status: Former Smoker    Quit date: 02/20/1992  . Smokeless tobacco: Not on file  . Alcohol Use: No    Review of Systems  Skin: Positive for wound.  Neurological: Negative for numbness.  All other systems reviewed and are negative.     Allergies  Motrin and Tramadol  Home Medications   Prior to Admission medications   Medication Sig Start Date End Date Taking? Authorizing Provider  acetaminophen (TYLENOL) 500 MG tablet Take 1 tablet (500 mg total) by mouth every 6 (six) hours as needed for pain. 06/28/12   Fayrene Helper, PA-C  cyclobenzaprine (FLEXERIL) 10 MG tablet Take 1 tablet (10 mg total) by mouth 2 (two) times daily as needed for muscle spasms. 06/28/12   Fayrene Helper, PA-C  hydrochlorothiazide (HYDRODIURIL) 25 MG tablet Take 25 mg by mouth daily.    Historical Provider, MD    There were no vitals taken for this visit.   Physical Exam  Constitutional: He is oriented to person, place, and time. He appears well-developed and well-nourished. No distress.  HENT:  Head: Normocephalic and atraumatic.  Eyes: Conjunctivae and EOM are normal.  Neck: Neck supple. No tracheal deviation present.  Cardiovascular: Normal rate.   Pulmonary/Chest: Effort normal. No respiratory distress.  Musculoskeletal: Normal range of motion.  Neurological: He is alert and oriented to person, place, and time.  Skin:  Superficial laceration to the base of the left thumb  Psychiatric: He has a normal mood and affect. His behavior is normal.  Nursing note and vitals reviewed.   ED Course  .Marland KitchenLaceration Repair Date/Time: 03/14/2015 7:00 PM Performed by: Teressa Lower Authorized by: Teressa Lower Consent: Verbal consent obtained. Risks and benefits: risks, benefits and alternatives were discussed Consent given by: patient Patient identity confirmed: verbally with patient Laceration length: 2 cm Irrigation solution: saline Skin closure: glue Approximation: close Approximation difficulty: simple Patient tolerance: Patient tolerated the procedure well with no immediate complications   (including critical care time) DIAGNOSTIC STUDIES:   COORDINATION OF CARE: 6:31 PM-Discussed treatment plan with pt at bedside and pt agreed to plan.     Labs Review Labs Reviewed - No data to display  Imaging Review No results found.    EKG Interpretation None      MDM  Final diagnoses:  Thumb laceration, left, initial encounter    Wound repaired without any problem. Discussed wound care and return precautions. Pt tdap updated  I personally performed the services described in this documentation, which was scribed in my presence. The recorded information has been reviewed and is accurate.      Teressa LowerVrinda Oluwatamilore Starnes, NP 03/14/15 [id number]  Teressa LowerVrinda Lue Sykora, NP 03/14/15  [id number]  Tilden FossaElizabeth Rees, MD 03/15/15 [phone]

## 2015-03-14 NOTE — ED Notes (Addendum)
Laceration to L thumb with kitchen knife pta,  cms intact, c/o pain at site, bleeding controlled

## 2015-11-29 IMAGING — CR DG ANKLE COMPLETE 3+V*L*
3 series · 3 of 3 positions shown · non-contrast
Comparison: None currently available

CLINICAL DATA: Medial and posterior left ankle pain after injury on
stairs today. Initial encounter.

EXAM:
LEFT ANKLE COMPLETE - 3+ VIEW

[x ankle ap left]
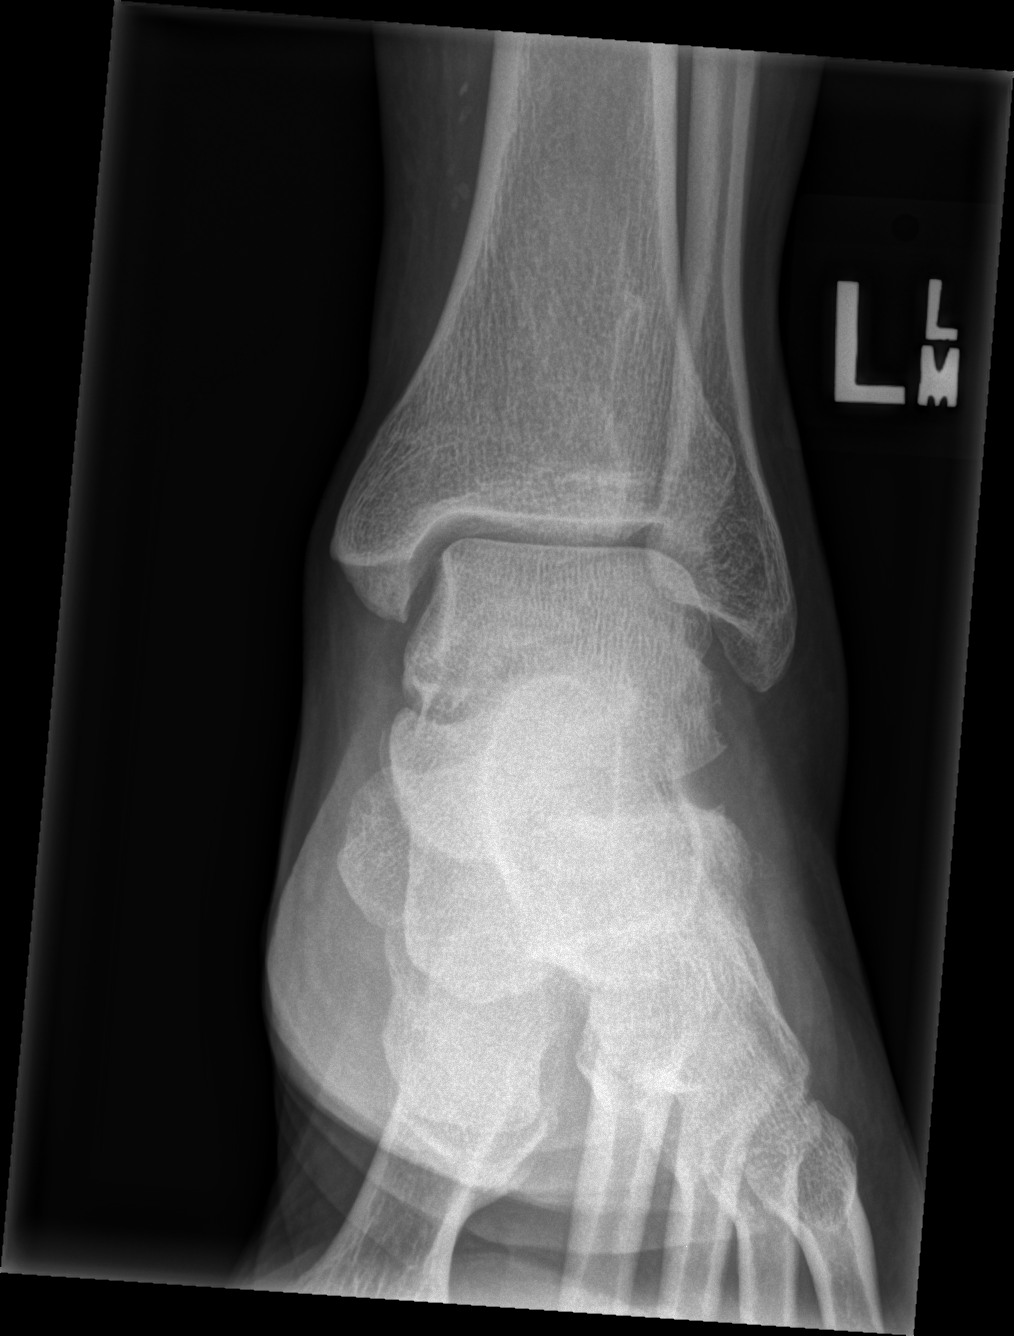

[x ankle obl left]
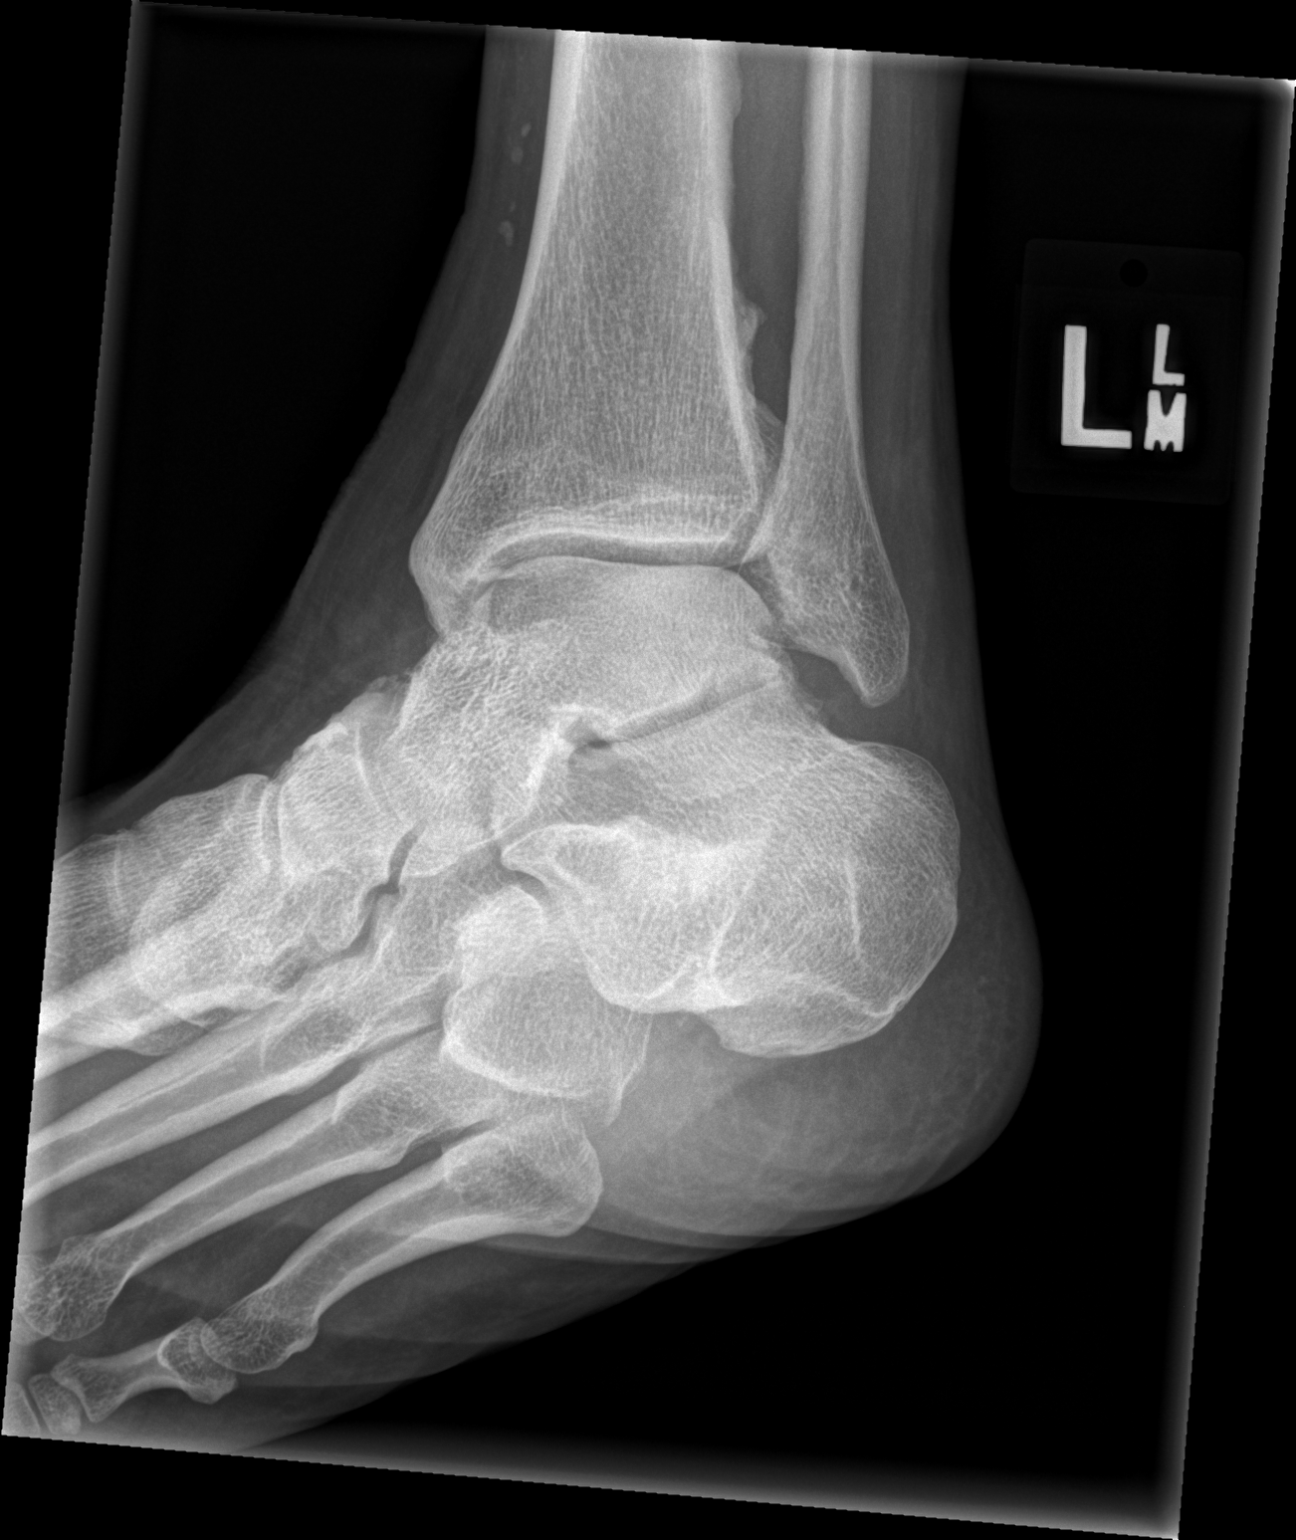

[x ankle lat left]
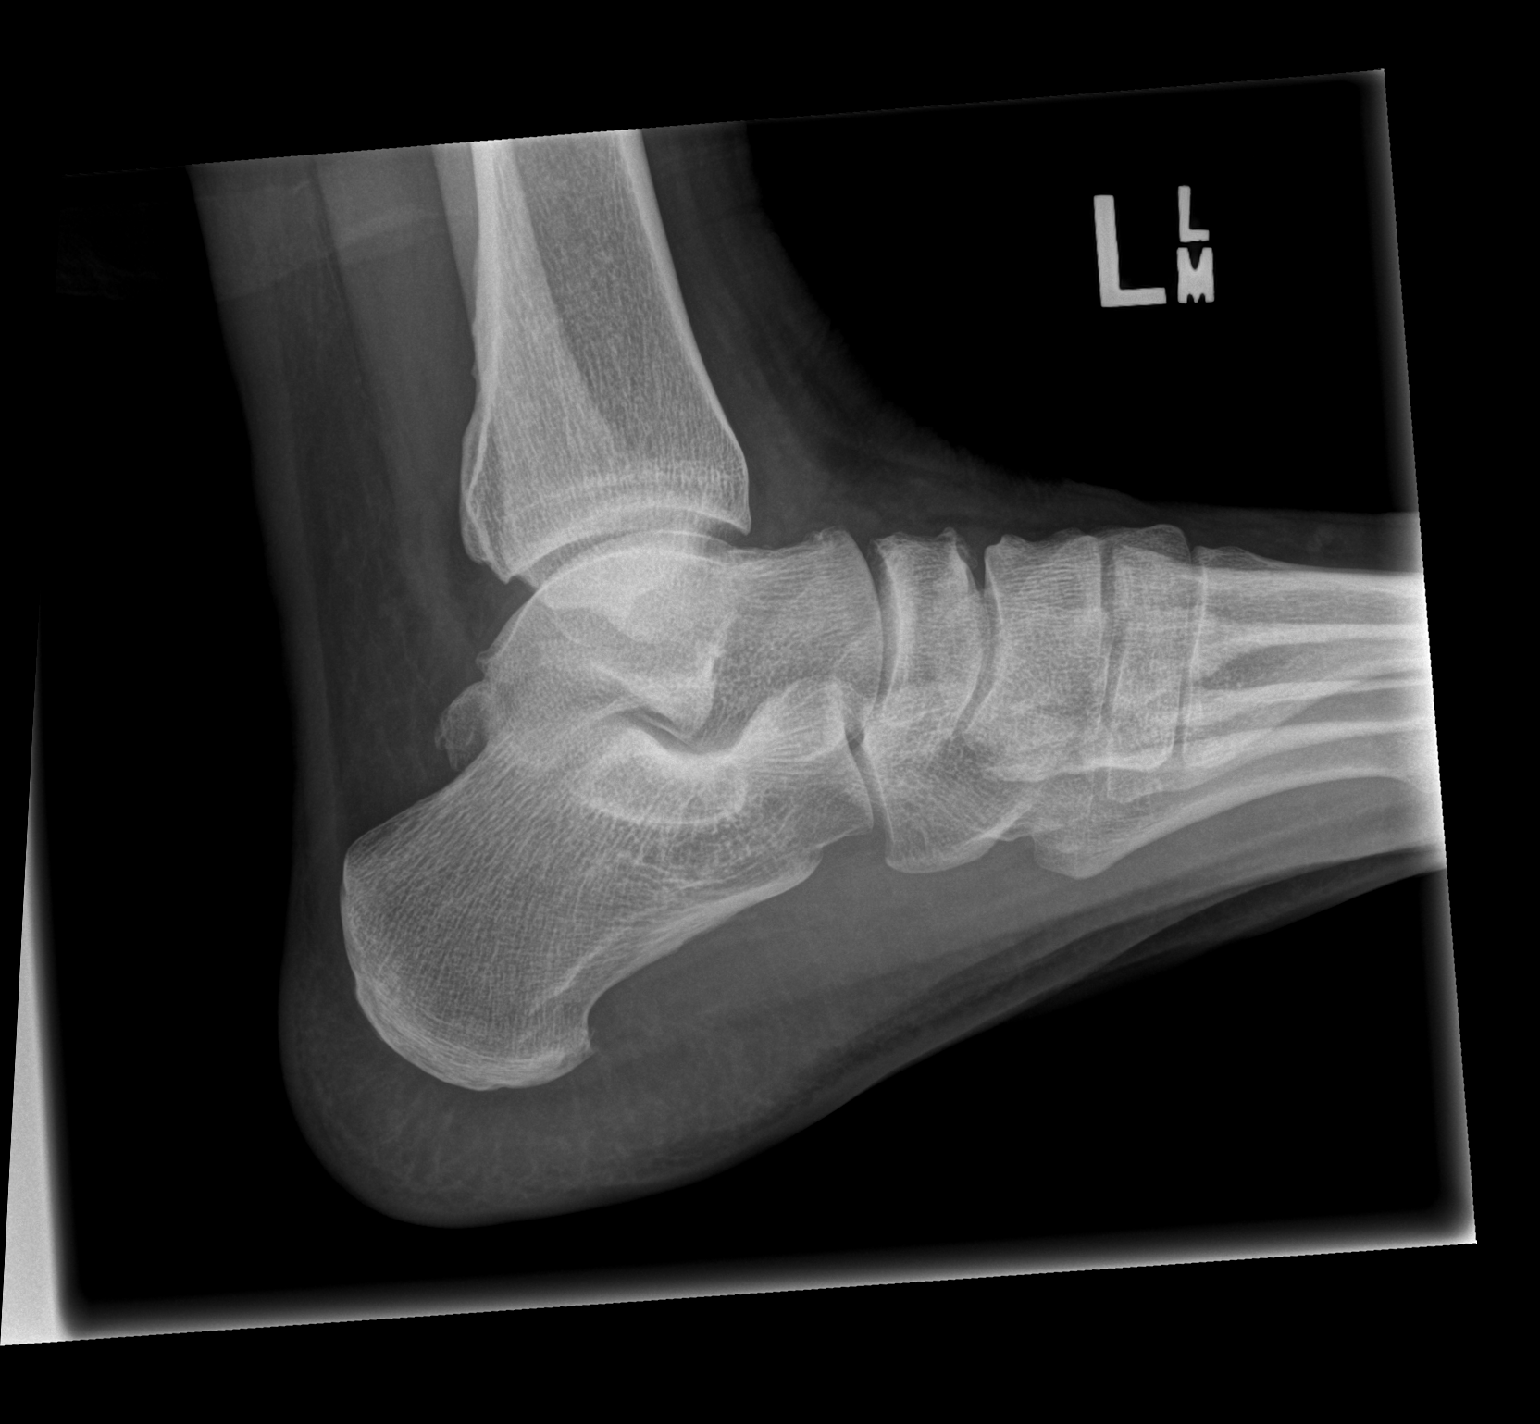

[3 of 3 positions shown; findings below may reference images not displayed]

FINDINGS: There is no evidence of fracture, dislocation, or joint effusion.
IMPRESSION: Negative.

## 2019-09-19 ENCOUNTER — Encounter (HOSPITAL_COMMUNITY): Payer: Self-pay | Admitting: Emergency Medicine

## 2019-09-19 ENCOUNTER — Emergency Department (HOSPITAL_COMMUNITY)
Admission: EM | Admit: 2019-09-19 | Discharge: 2019-09-19 | Disposition: A | Discharge status: Home / Self Care | Payer: Medicaid - Out of State | Attending: Emergency Medicine | Admitting: Emergency Medicine

## 2019-09-19 ENCOUNTER — Other Ambulatory Visit: Payer: Self-pay

## 2019-09-19 DIAGNOSIS — Z87891 Personal history of nicotine dependence: Secondary | ICD-10-CM | POA: Insufficient documentation

## 2019-09-19 DIAGNOSIS — T7840XA Allergy, unspecified, initial encounter: Secondary | ICD-10-CM | POA: Insufficient documentation

## 2019-09-19 DIAGNOSIS — Z79899 Other long term (current) drug therapy: Secondary | ICD-10-CM | POA: Insufficient documentation

## 2019-09-19 DIAGNOSIS — I10 Essential (primary) hypertension: Secondary | ICD-10-CM | POA: Insufficient documentation

## 2019-09-19 MED ORDER — METHYLPREDNISOLONE SODIUM SUCC 125 MG IJ SOLR
125.0000 mg | Freq: Once | INTRAMUSCULAR | Status: AC
  Administered 2019-09-19: 125 mg via INTRAVENOUS
  Filled 2019-09-19: qty 2

## 2019-09-19 MED ORDER — DIPHENHYDRAMINE HCL 50 MG/ML IJ SOLN
25.0000 mg | Freq: Once | INTRAMUSCULAR | Status: AC
  Administered 2019-09-19: 25 mg via INTRAVENOUS
  Filled 2019-09-19: qty 1

## 2019-09-19 MED ORDER — FAMOTIDINE IN NACL 20-0.9 MG/50ML-% IV SOLN
20.0000 mg | INTRAVENOUS | Status: AC
  Administered 2019-09-19: 20 mg via INTRAVENOUS
  Filled 2019-09-19: qty 50

## 2019-09-19 MED ORDER — PREDNISONE 20 MG PO TABS: 40.0000 mg | ORAL_TABLET | Freq: Every day | ORAL | 0 refills | Status: AC

## 2019-09-19 NOTE — ED Notes (Signed)
Pt. States complete relief of symptoms. Would like to go home at this time.

## 2019-09-19 NOTE — ED Notes (Signed)
Discharge instructions including prescription and follow up care discussed with pt. Pt verbalized understanding with no questions a this time. Pt significant other at bedside

## 2019-09-19 NOTE — ED Triage Notes (Signed)
Pt c/o mouth, tongue, and facial swelling that started after eating dirty rice. Pt denies known food allergies.

## 2019-09-19 NOTE — ED Provider Notes (Signed)
[facility] EMERGENCY DEPARTMENT Provider Note   CSN: [id number] Arrival date & time: 09/19/19  0005     History Chief Complaint  Patient presents with  . Allergic Reaction    Douglas Owens is a 59 y.o. male.  Patient presents to the emergency department with a chief complaint of allergic reaction.  He states that he was eating dinner tonight and ate dirty rice and pork and began having facial swelling and what he believes was throat swelling.  He states that it feels like his throat is closing.  He came to the emergency department for evaluation.  He has not taken anything for symptoms.  He denies having had an allergic reaction like this before.  Denies any vomiting or diarrhea.  Denies any rash.  The history is provided by the patient. No language interpreter was used.       Past Medical History:  Diagnosis Date  . Chronic back pain   . Hypertension     There are no problems to display for this patient.   Past Surgical History:  Procedure Laterality Date  . BACK SURGERY    . UMBILICAL HERNIA REPAIR         No family history on file.  Social History   Tobacco Use  . Smoking status: Former Smoker    Quit date: 02/20/1992    Years since quitting: 27.5  Substance Use Topics  . Alcohol use: No  . Drug use: No    Home Medications Prior to Admission medications   Medication Sig Start Date End Date Taking? Authorizing Provider  acetaminophen (TYLENOL) 500 MG tablet Take 1 tablet (500 mg total) by mouth every 6 (six) hours as needed for pain. 06/28/12   Domenic Moras, PA-C  cyclobenzaprine (FLEXERIL) 10 MG tablet Take 1 tablet (10 mg total) by mouth 2 (two) times daily as needed for muscle spasms. 06/28/12   Domenic Moras, PA-C  hydrochlorothiazide (HYDRODIURIL) 25 MG tablet Take 25 mg by mouth daily.    [provider]    Allergies    Motrin [ibuprofen] and Tramadol  Review of Systems   Review of Systems  All other systems reviewed and are  negative.   Physical Exam Updated Vital Signs BP (!) 174/89 (BP Location: Right Arm)   Pulse 78   Temp 97.9 F (36.6 C) (Oral)   Resp 16   SpO2 97%   Physical Exam Vitals and nursing note reviewed.  Constitutional:      Appearance: He is well-developed.  HENT:     Head: Normocephalic and atraumatic.     Mouth/Throat:     Comments: Oropharynx and tongue appear normal to me There is some faint swelling of the cheeks Eyes:     Conjunctiva/sclera: Conjunctivae normal.  Cardiovascular:     Rate and Rhythm: Normal rate and regular rhythm.     Heart sounds: No murmur.  Pulmonary:     Effort: Pulmonary effort is normal. No respiratory distress.     Breath sounds: Normal breath sounds.  Abdominal:     Palpations: Abdomen is soft.     Tenderness: There is no abdominal tenderness.  Musculoskeletal:     Cervical back: Neck supple.  Skin:    General: Skin is warm and dry.  Neurological:     Mental Status: He is alert.  Psychiatric:        Mood and Affect: Mood normal.        Behavior: Behavior normal.  ED Results / Procedures / Treatments   Labs (all labs ordered are listed, but only abnormal results are displayed) Labs Reviewed - No data to display  EKG None  Radiology No results found.  Procedures Procedures (including critical care time)  Medications Ordered in ED Medications  famotidine (PEPCID) IVPB 20 mg premix (has no administration in time range)  methylPREDNISolone sodium succinate (SOLU-MEDROL) 125 mg/2 mL injection 125 mg (has no administration in time range)  diphenhydrAMINE (BENADRYL) injection 25 mg (has no administration in time range)    ED Course  I have reviewed the triage vital signs and the nursing notes.  Pertinent labs & imaging results that were available during my care of the patient were reviewed by me and considered in my medical decision making (see chart for details).    MDM Rules/Calculators/A&P                      Patient  here with allergic reaction after eating dinner tonight.  He denies any known history of food allergies.  He states that it felt like his throat was swelling.  On exam, he shows no evidence of edema or swelling of the oropharynx.  His tongue appears normal.  He states that his voice sounds unusual, but he does not exhibit any stridor, and seems to speak easily.  I see no significant hives, he has not had any vomiting or diarrhea.  His lung sounds are clear.  Doubt anaphylactic reaction.  I will give him Benadryl, Solu-Medrol, Pepcid, and reassess.  Patient reassessed, he states that he is feeling much better.  I will observe him for 1 additional hour to ensure that his symptoms do not return.  Patient reassessed, states that he still feels well, will plan for discharge and outpatient follow-up with an allergist.  Return precautions discussed.  Patient encouraged to continue Benadryl as needed, I did write the patient for a short course of prednisone. Final Clinical Impression(s) / ED Diagnoses Final diagnoses:  Allergic reaction, initial encounter    Rx / DC Orders ED Discharge Orders         Ordered    predniSONE (DELTASONE) 20 MG tablet  Daily     09/19/19 0200           Roxy Horseman, PA-C 09/19/19 0230    Dione Booze, MD 09/19/19 [phone]

## 2021-05-20 ENCOUNTER — Emergency Department (HOSPITAL_BASED_OUTPATIENT_CLINIC_OR_DEPARTMENT_OTHER)
Admission: EM | Admit: 2021-05-20 | Discharge: 2021-05-20 | Disposition: A | Discharge status: Home / Self Care | Payer: Medicaid Other | Attending: Emergency Medicine | Admitting: Emergency Medicine

## 2021-05-20 ENCOUNTER — Encounter (HOSPITAL_BASED_OUTPATIENT_CLINIC_OR_DEPARTMENT_OTHER): Payer: Self-pay

## 2021-05-20 ENCOUNTER — Other Ambulatory Visit: Payer: Self-pay

## 2021-05-20 DIAGNOSIS — R0982 Postnasal drip: Secondary | ICD-10-CM | POA: Insufficient documentation

## 2021-05-20 DIAGNOSIS — Z20822 Contact with and (suspected) exposure to covid-19: Secondary | ICD-10-CM | POA: Insufficient documentation

## 2021-05-20 DIAGNOSIS — J069 Acute upper respiratory infection, unspecified: Secondary | ICD-10-CM | POA: Insufficient documentation

## 2021-05-20 DIAGNOSIS — R059 Cough, unspecified: Secondary | ICD-10-CM | POA: Diagnosis present

## 2021-05-20 LAB — RESP PANEL BY RT-PCR (FLU A&B, COVID) ARPGX2
Influenza A by PCR: NEGATIVE
Influenza B by PCR: NEGATIVE
SARS Coronavirus 2 by RT PCR: NEGATIVE

## 2021-05-20 MED ORDER — BENZONATATE 100 MG PO CAPS: 100.0000 mg | ORAL_CAPSULE | Freq: Three times a day (TID) | ORAL | 0 refills | Status: DC

## 2021-05-20 MED ORDER — ONDANSETRON 4 MG PO TBDP: ORAL_TABLET | ORAL | 0 refills | Status: DC

## 2021-05-20 MED ORDER — BENZONATATE 100 MG PO CAPS: 100.0000 mg | ORAL_CAPSULE | Freq: Three times a day (TID) | ORAL | 0 refills | Status: AC

## 2021-05-20 MED ORDER — ONDANSETRON 4 MG PO TBDP: ORAL_TABLET | ORAL | 0 refills | Status: AC

## 2021-05-20 NOTE — Discharge Instructions (Signed)
Take tylenol 2 pills 4 times a day and motrin 4 pills 3 times a day.  Drink plenty of fluids.  Return for worsening shortness of breath, headache, confusion. Follow up with your family doctor.   

## 2021-05-20 NOTE — ED Triage Notes (Signed)
Pt c/o cough, headache, sore throat, chills, malaise since last night.

## 2021-05-20 NOTE — ED Provider Notes (Signed)
[facility] EMERGENCY DEPARTMENT Provider Note   CSN: [id number] Arrival date & time: 05/20/21  [id number]     History  Chief Complaint  Patient presents with   URI    Douglas Owens is a 61 y.o. male.  62 yo M with a cc of cough congestion headache decreased oral intake going on for a few days now.  No known sick contacts.  No significant difficulty breathing no abdominal discomfort some mild nausea.  The history is provided by the patient.  URI Illness Severity:  Moderate Onset quality:  Gradual Duration:  2 days Timing:  Constant Progression:  Worsening Chronicity:  New     Home Medications Prior to Admission medications   Medication Sig Start Date End Date Taking? Authorizing Provider  acetaminophen (TYLENOL) 500 MG tablet Take 1 tablet (500 mg total) by mouth every 6 (six) hours as needed for pain. 06/28/12   Domenic Moras, PA-C  benzonatate (TESSALON) 100 MG capsule Take 1 capsule (100 mg total) by mouth every 8 (eight) hours. 05/20/21   Deno Etienne, DO  cyclobenzaprine (FLEXERIL) 10 MG tablet Take 1 tablet (10 mg total) by mouth 2 (two) times daily as needed for muscle spasms. 06/28/12   Domenic Moras, PA-C  hydrochlorothiazide (HYDRODIURIL) 25 MG tablet Take 25 mg by mouth daily.    [provider]  ondansetron (ZOFRAN-ODT) 4 MG disintegrating tablet 4mg  ODT q4 hours prn nausea/vomit 05/20/21   Deno Etienne, DO  predniSONE (DELTASONE) 20 MG tablet Take 2 tablets (40 mg total) by mouth daily. Take 40 mg by mouth daily for 3 days, then 20mg  by mouth daily for 3 days, then 10mg  daily for 3 days 09/19/19   Montine Circle, PA-C      Allergies    Motrin [ibuprofen] and Tramadol    Review of Systems   Review of Systems  Physical Exam Updated Vital Signs BP (!) 157/95 (BP Location: Right Arm)    Pulse 81    Temp 98.3 F (36.8 C) (Oral)    Resp 18    Ht 6\' 2"  (1.88 m)    Wt 110.2 kg    SpO2 96%    BMI 31.20 kg/m  Physical Exam Vitals and nursing note reviewed.   Constitutional:      Appearance: He is well-developed.  HENT:     Head: Normocephalic and atraumatic.     Comments: Swollen turbinates, posterior nasal drip, no noted sinus ttp, tm normal bilaterally.   Eyes:     Pupils: Pupils are equal, round, and reactive to light.  Neck:     Vascular: No JVD.  Cardiovascular:     Rate and Rhythm: Normal rate and regular rhythm.     Heart sounds: No murmur heard.   No friction rub. No gallop.  Pulmonary:     Effort: No respiratory distress.     Breath sounds: No wheezing.  Abdominal:     General: There is no distension.     Tenderness: There is no abdominal tenderness. There is no guarding or rebound.  Musculoskeletal:        General: Normal range of motion.     Cervical back: Normal range of motion and neck supple.  Skin:    Coloration: Skin is not pale.     Findings: No rash.  Neurological:     Mental Status: He is alert and oriented to person, place, and time.  Psychiatric:        Behavior: Behavior normal.    ED  Results / Procedures / Treatments   Labs (all labs ordered are listed, but only abnormal results are displayed) Labs Reviewed  RESP PANEL BY RT-PCR (FLU A&B, COVID) [id number]    EKG None  Radiology No results found.  Procedures Procedures    Medications Ordered in ED Medications - No data to display  ED Course/ Medical Decision Making/ A&P                           Medical Decision Making Risk Prescription drug management.   61 yo M with a chief complaints of cough congestion going on for a couple days.  He is well-appearing and nontoxic is clear lung sounds on my exam.  He had COVID and flu testing that was negative.  Has been able to eat and drink at home without issue do not feel that lab work is warranted.  We will treat supportively.  Have him follow-up with his family doctor in the office.  10:57 AM:  I have discussed the diagnosis/risks/treatment options with the patient and family and believe the pt  to be eligible for discharge home to follow-up with PCP. We also discussed returning to the ED immediately if new or worsening sx occur. We discussed the sx which are most concerning (e.g., sudden worsening pain, fever, inability to tolerate by mouth) that necessitate immediate return. Medications administered to the patient during their visit and any new prescriptions provided to the patient are listed below.  Medications given during this visit Medications - No data to display   The patient appears reasonably screen and/or stabilized for discharge and I doubt any other medical condition or other [facility] requiring further screening, evaluation, or treatment in the ED at this time prior to discharge.          Final Clinical Impression(s) / ED Diagnoses Final diagnoses:  Upper respiratory tract infection, unspecified type    Rx / DC Orders ED Discharge Orders          Ordered    ondansetron (ZOFRAN-ODT) 4 MG disintegrating tablet  Status:  Discontinued        05/20/21 1037    benzonatate (TESSALON) 100 MG capsule  Every 8 hours,   Status:  Discontinued        05/20/21 1037    benzonatate (TESSALON) 100 MG capsule  Every 8 hours        05/20/21 1044    ondansetron (ZOFRAN-ODT) 4 MG disintegrating tablet        05/20/21 [place], Gryffin Altice, DO 05/20/21 1057

## 2022-03-21 ENCOUNTER — Emergency Department (HOSPITAL_BASED_OUTPATIENT_CLINIC_OR_DEPARTMENT_OTHER): Payer: Medicaid Other

## 2022-03-21 ENCOUNTER — Emergency Department (HOSPITAL_BASED_OUTPATIENT_CLINIC_OR_DEPARTMENT_OTHER)
Admission: EM | Admit: 2022-03-21 | Discharge: 2022-03-21 | Disposition: A | Discharge status: Home / Self Care | Payer: Medicaid Other | Attending: Emergency Medicine | Admitting: Emergency Medicine

## 2022-03-21 ENCOUNTER — Other Ambulatory Visit: Payer: Self-pay

## 2022-03-21 ENCOUNTER — Encounter (HOSPITAL_BASED_OUTPATIENT_CLINIC_OR_DEPARTMENT_OTHER): Payer: Self-pay | Admitting: Emergency Medicine

## 2022-03-21 DIAGNOSIS — R110 Nausea: Secondary | ICD-10-CM | POA: Insufficient documentation

## 2022-03-21 DIAGNOSIS — I10 Essential (primary) hypertension: Secondary | ICD-10-CM | POA: Insufficient documentation

## 2022-03-21 DIAGNOSIS — S3992XA Unspecified injury of lower back, initial encounter: Secondary | ICD-10-CM | POA: Diagnosis present

## 2022-03-21 DIAGNOSIS — Z79899 Other long term (current) drug therapy: Secondary | ICD-10-CM | POA: Insufficient documentation

## 2022-03-21 DIAGNOSIS — S39012A Strain of muscle, fascia and tendon of lower back, initial encounter: Secondary | ICD-10-CM | POA: Diagnosis not present

## 2022-03-21 DIAGNOSIS — E119 Type 2 diabetes mellitus without complications: Secondary | ICD-10-CM | POA: Insufficient documentation

## 2022-03-21 DIAGNOSIS — X58XXXA Exposure to other specified factors, initial encounter: Secondary | ICD-10-CM | POA: Diagnosis not present

## 2022-03-21 HISTORY — DX: Type 2 diabetes mellitus without complications: E11.9

## 2022-03-21 LAB — URINALYSIS, MICROSCOPIC (REFLEX)

## 2022-03-21 LAB — URINALYSIS, ROUTINE W REFLEX MICROSCOPIC
Bilirubin Urine: NEGATIVE
Glucose, UA: NEGATIVE mg/dL
Hgb urine dipstick: NEGATIVE
Ketones, ur: NEGATIVE mg/dL
Leukocytes,Ua: NEGATIVE
Nitrite: NEGATIVE
Protein, ur: 30 mg/dL — AB
Specific Gravity, Urine: 1.015 (ref 1.005–1.030)
pH: 7 (ref 5.0–8.0)

## 2022-03-21 MED ORDER — LIDOCAINE 5 % EX PTCH
1.0000 | MEDICATED_PATCH | CUTANEOUS | Status: DC
  Administered 2022-03-21: 1 via TRANSDERMAL
  Filled 2022-03-21: qty 1

## 2022-03-21 MED ORDER — OXYCODONE-ACETAMINOPHEN 5-325 MG PO TABS
1.0000 | ORAL_TABLET | Freq: Once | ORAL | Status: AC
Start: 2022-03-21 — End: 2022-03-21
  Administered 2022-03-21: 1 via ORAL
  Filled 2022-03-21: qty 1

## 2022-03-21 MED ORDER — LIDOCAINE 5 % EX PTCH: 1.0000 | MEDICATED_PATCH | CUTANEOUS | 0 refills | Status: AC

## 2022-03-21 MED ORDER — OXYCODONE-ACETAMINOPHEN 5-325 MG PO TABS: 1.0000 | ORAL_TABLET | Freq: Four times a day (QID) | ORAL | 0 refills | Status: AC | PRN

## 2022-03-21 NOTE — Discharge Instructions (Addendum)
You were seen for your back pain and it is likely that you strained a muscle.   At home, please take over-the-counter anti-inflammatory medications (NSAIDs) as well as the lidocaine patches that we have prescribed you.  You may use the Percocet as needed for any severe pain.    Follow-up with your primary doctor in 2-3 days regarding your visit.    Return immediately to the emergency department if you experience any of the following: Worsening pain, numbness or weakness of your legs, bowel or bladder incontinence, or any other concerning symptoms.    Thank you for visiting our Emergency Department. It was a pleasure taking care of you today.

## 2022-03-21 NOTE — ED Provider Notes (Signed)
[facility] EMERGENCY DEPARTMENT Provider Note   CSN: [id number] Arrival date & time: 03/21/22  1057     History  Chief Complaint  Patient presents with   Back Pain    Douglas Owens is a 61 y.o. male.  61 year old male with a history of spinal fusion, diabetes, hypertension, and kidney stones who comes the emergency department with left back pain.  Patient states that he has pain radiating from his left back to his left gluteus.  Says that he is not recall any traumatic injuries to the area.  It is intermittent and sharp.  Worsened with movement as well as urination.  Nauseous without vomiting.  No fevers or chills.  No dysuria or frequency.  No weakness or numbness of his legs or bowel or bladder incontinence.       Home Medications Prior to Admission medications   Medication Sig Start Date End Date Taking? Authorizing Provider  lidocaine (LIDODERM) 5 % Place 1 patch onto the skin daily. Remove & Discard patch within 12 hours or as directed by MD 03/21/22  Yes Rondel Baton, MD  oxyCODONE-acetaminophen (PERCOCET/ROXICET) 5-325 MG tablet Take 1 tablet by mouth every 6 (six) hours as needed for severe pain. 03/21/22  Yes Rondel Baton, MD  acetaminophen (TYLENOL) 500 MG tablet Take 1 tablet (500 mg total) by mouth every 6 (six) hours as needed for pain. 06/28/12   Fayrene Helper, PA-C  benzonatate (TESSALON) 100 MG capsule Take 1 capsule (100 mg total) by mouth every 8 (eight) hours. 05/20/21   Melene Plan, DO  cyclobenzaprine (FLEXERIL) 10 MG tablet Take 1 tablet (10 mg total) by mouth 2 (two) times daily as needed for muscle spasms. 06/28/12   Fayrene Helper, PA-C  hydrochlorothiazide (HYDRODIURIL) 25 MG tablet Take 25 mg by mouth daily.    [provider]  ondansetron (ZOFRAN-ODT) 4 MG disintegrating tablet 4mg  ODT q4 hours prn nausea/vomit 05/20/21   05/22/21, DO  predniSONE (DELTASONE) 20 MG tablet Take 2 tablets (40 mg total) by mouth daily. Take 40 mg by mouth  daily for 3 days, then 20mg  by mouth daily for 3 days, then 10mg  daily for 3 days 09/19/19   , PA-C      Allergies    Motrin [ibuprofen] and Tramadol    Review of Systems   Review of Systems  Physical Exam Updated Vital Signs BP (!) 140/89 (BP Location: Right Arm)   Pulse 72   Temp 98.4 F (36.9 C) (Oral)   Resp 18   Ht 6\' 2"  (1.88 m)   Wt (!) 145.2 kg   SpO2 100%   BMI 41.09 kg/m  Physical Exam Vitals and nursing note reviewed.  Constitutional:      General: He is not in acute distress.    Appearance: He is well-developed.  HENT:     Head: Normocephalic and atraumatic.     Right Ear: External ear normal.     Left Ear: External ear normal.     Nose: Nose normal.  Eyes:     Extraocular Movements: Extraocular movements intact.     Conjunctiva/sclera: Conjunctivae normal.     Pupils: Pupils are equal, round, and reactive to light.  Pulmonary:     Effort: Pulmonary effort is normal. No respiratory distress.  Abdominal:     General: There is no distension.     Palpations: Abdomen is soft. There is no mass.     Tenderness: There is no abdominal tenderness. There is  left CVA tenderness. There is no right CVA tenderness or guarding.  Musculoskeletal:        General: No swelling.     Cervical back: Normal range of motion and neck supple.     Right lower leg: No edema.     Left lower leg: No edema.     Comments: Tenderness to palpation of left paraspinal muscles from mid thoracic to lumbar region.  No midline tenderness to palpation or step-offs of the spine.  Skin:    General: Skin is warm and dry.     Capillary Refill: Capillary refill takes less than 2 seconds.  Neurological:     Mental Status: He is alert. Mental status is at baseline.     Comments: 5/5 strength in bilateral knee extension, ankle plantar and dorsiflexion.  4/5 strength in left knee flexion due to pain.  Intact sensation light touch of lower extremities.  No saddle anesthesia.   Psychiatric:        Mood and Affect: Mood normal.        Behavior: Behavior normal.     ED Results / Procedures / Treatments   Labs (all labs ordered are listed, but only abnormal results are displayed) Labs Reviewed  URINALYSIS, ROUTINE W REFLEX MICROSCOPIC - Abnormal; Notable for the following components:      Result Value   Protein, ur 30 (*)    All other components within normal limits  URINALYSIS, MICROSCOPIC (REFLEX) - Abnormal; Notable for the following components:   Bacteria, UA RARE (*)    All other components within normal limits    EKG None  Radiology CT Renal Stone Study  Result Date: 03/21/2022 CLINICAL DATA:  61 year old male with acute LEFT abdominal, pelvic and flank pain 2-3 days ago. EXAM: CT ABDOMEN AND PELVIS WITHOUT CONTRAST TECHNIQUE: Multidetector CT imaging of the abdomen and pelvis was performed following the standard protocol without IV contrast. RADIATION DOSE REDUCTION: This exam was performed according to the departmental dose-optimization program which includes automated exposure control, adjustment of the mA and/or kV according to patient size and/or use of iterative reconstruction technique. COMPARISON:  None Available. FINDINGS: Please note that parenchymal and vascular abnormalities may be missed as intravenous contrast was not administered. Lower chest: No acute abnormality. Hepatobiliary: Hepatic steatosis noted. The gallbladder is unremarkable. There is no evidence of intrahepatic or extrahepatic biliary dilatation. Pancreas: Unremarkable Spleen: Unremarkable Adrenals/Urinary Tract: 2 punctate nonobstructing LEFT LOWER pole renal calculi are noted. Renal cysts are noted and no follow-up imaging recommended. There is no evidence of hydronephrosis or obstructing urinary calculi. The adrenal glands and bladder are unremarkable. Stomach/Bowel: Stomach is within normal limits. Appendix appears normal. No evidence of bowel wall thickening, distention, or  inflammatory changes. Vascular/Lymphatic: Aortic atherosclerosis. No enlarged abdominal or pelvic lymph nodes. Reproductive: Prostate is unremarkable. Other:  No ascites, focal collection or pneumoperitoneum. Musculoskeletal: No acute or suspicious bony abnormalities are noted. Surgical changes at L5-S1 noted. IMPRESSION: 1. No evidence of acute abnormality. 2. Hepatic steatosis. 3. Punctate nonobstructing LEFT LOWER pole renal calculi. 4.  Aortic Atherosclerosis (ICD10-I70.0). Electronically Signed   By: Harmon Pier M.D.   On: 03/21/2022 12:05    Procedures Procedures   Medications Ordered in ED Medications  oxyCODONE-acetaminophen (PERCOCET/ROXICET) 5-325 MG per tablet 1 tablet (1 tablet Oral Given 03/21/22 1133)    ED Course/ Medical Decision Making/ A&P Clinical Course as of 03/22/22 1430  Sun Mar 21, 2022  1212 CT Renal Stone Study Punctate nonobstructing LEFT LOWER pole renal  calculi. [RP]    Clinical Course User Index [RP] Rondel Baton, MD                           Medical Decision Making Amount and/or Complexity of Data Reviewed Labs: ordered. Radiology: ordered. Decision-making details documented in ED Course.  Risk Prescription drug management.   Jamian Ballow is a 62 y.o. male with comorbidities that complicate the patient evaluation including kidney stones who presents with chief complaint of left flank/back pain.  Initial Ddx:  MSK strain, radiculopathy, kidney stone, pyelonephritis  MDM:  Feel that pt likely has MSK strain given the ttp and exacerbtion with movement. However, given the lack of known injury may also be 2/2 radiculopathy or kidney stone. No infectious sxs to suggest pyelo. No sxs concerning for cord compression.   Plan:  UA CT abdomen/pelvis stone protocol Considered labs but do not feel they are indicated if CT scan is negative  ED Summary/Re-evaluation:  Pt had the following evaluation which did show L sided kidney stones that were in the  calyces and were non-obstructing. Feel that these are not likely causing his symptoms though based on the location. Also without hematuria making stone less likely. No signs of infection on UA. Pt was given short rx for percocet for his back pain. Will have him fu with his primary doctor for additional mgmt.   This patient presents to the ED for concern of complaints listed in HPI, this involves an extensive number of treatment options, and is a complaint that carries with it a high risk of complications and morbidity. Disposition including potential need for admission considered.   Dispo: DC Home. Return precautions discussed including, but not limited to, those listed in the AVS. Allowed pt time to ask questions which were answered fully prior to dc.  Additional history obtained from significant other Records reviewed Outpatient Clinic Notes The following labs were independently interpreted: Urinalysis and show no acute abnormality I independently reviewed the following imaging with scope of interpretation limited to determining acute life threatening conditions related to emergency care: CT Abdomen/Pelvis, which revealed  no obstructing stones   I personally reviewed and interpreted cardiac monitoring: normal sinus rhythm  I personally reviewed and interpreted the pt's EKG: see above for interpretation  I have reviewed the patients home medications and made adjustments as needed  Final Clinical Impression(s) / ED Diagnoses Final diagnoses:  Strain of lumbar region, initial encounter    Rx / DC Orders ED Discharge Orders          Ordered    oxyCODONE-acetaminophen (PERCOCET/ROXICET) 5-325 MG tablet  Every 6 hours PRN        03/21/22 1217    lidocaine (LIDODERM) 5 %  Every 24 hours        03/21/22 1217              Rondel Baton, MD 03/22/22 1430

## 2022-03-21 NOTE — ED Triage Notes (Signed)
Patient c/o left lower back pain that radiates up the left side of his back. States the pain started approx. 2-3 days ago. Denies any known injury. Pain increases with movement.

## 2022-05-12 ENCOUNTER — Emergency Department (HOSPITAL_BASED_OUTPATIENT_CLINIC_OR_DEPARTMENT_OTHER)
Admission: EM | Admit: 2022-05-12 | Discharge: 2022-05-12 | Disposition: A | Discharge status: Home / Self Care | Payer: Medicaid Other | Attending: Emergency Medicine | Admitting: Emergency Medicine

## 2022-05-12 ENCOUNTER — Emergency Department (HOSPITAL_BASED_OUTPATIENT_CLINIC_OR_DEPARTMENT_OTHER): Payer: Medicaid Other

## 2022-05-12 ENCOUNTER — Encounter (HOSPITAL_BASED_OUTPATIENT_CLINIC_OR_DEPARTMENT_OTHER): Payer: Self-pay | Admitting: Emergency Medicine

## 2022-05-12 ENCOUNTER — Other Ambulatory Visit: Payer: Self-pay

## 2022-05-12 DIAGNOSIS — Z1152 Encounter for screening for COVID-19: Secondary | ICD-10-CM | POA: Insufficient documentation

## 2022-05-12 DIAGNOSIS — W44F3XA Food entering into or through a natural orifice, initial encounter: Secondary | ICD-10-CM

## 2022-05-12 DIAGNOSIS — I10 Essential (primary) hypertension: Secondary | ICD-10-CM | POA: Diagnosis not present

## 2022-05-12 DIAGNOSIS — R1110 Vomiting, unspecified: Secondary | ICD-10-CM | POA: Diagnosis present

## 2022-05-12 LAB — URINALYSIS, ROUTINE W REFLEX MICROSCOPIC
Bilirubin Urine: NEGATIVE
Glucose, UA: NEGATIVE mg/dL
Hgb urine dipstick: NEGATIVE
Ketones, ur: NEGATIVE mg/dL
Leukocytes,Ua: NEGATIVE
Nitrite: NEGATIVE
Protein, ur: NEGATIVE mg/dL
Specific Gravity, Urine: 1.02 (ref 1.005–1.030)
pH: 7 (ref 5.0–8.0)

## 2022-05-12 LAB — COMPREHENSIVE METABOLIC PANEL
ALT: 20 U/L (ref 0–44)
AST: 30 U/L (ref 15–41)
Albumin: 4.3 g/dL (ref 3.5–5.0)
Alkaline Phosphatase: 69 U/L (ref 38–126)
Anion gap: 9 (ref 5–15)
BUN: 13 mg/dL (ref 8–23)
CO2: 32 mmol/L (ref 22–32)
Calcium: 9.2 mg/dL (ref 8.9–10.3)
Chloride: 99 mmol/L (ref 98–111)
Creatinine, Ser: 1.29 mg/dL — ABNORMAL HIGH (ref 0.61–1.24)
GFR, Estimated: >60 mL/min (ref >60)
Glucose, Bld: 126 mg/dL — ABNORMAL HIGH (ref 70–99)
Potassium: 3.7 mmol/L (ref 3.5–5.1)
Sodium: 140 mmol/L (ref 135–145)
Total Bilirubin: 0.5 mg/dL (ref 0.3–1.2)
Total Protein: 8 g/dL (ref 6.5–8.1)

## 2022-05-12 LAB — CBC
HCT: 31.4 % — ABNORMAL LOW (ref 39.0–52.0)
Hemoglobin: 10 g/dL — ABNORMAL LOW (ref 13.0–17.0)
MCH: 29.8 pg (ref 26.0–34.0)
MCHC: 31.8 g/dL (ref 30.0–36.0)
MCV: 93.5 fL (ref 80.0–100.0)
Platelets: 288 10*3/uL (ref 150–400)
RBC: 3.36 MIL/uL — ABNORMAL LOW (ref 4.22–5.81)
RDW: 14.6 % (ref 11.5–15.5)
WBC: 6 10*3/uL (ref 4.0–10.5)
nRBC: 0 % (ref 0.0–0.2)

## 2022-05-12 LAB — RESP PANEL BY RT-PCR (RSV, FLU A&B, COVID)  RVPGX2
Influenza A by PCR: NEGATIVE
Influenza B by PCR: NEGATIVE
Resp Syncytial Virus by PCR: NEGATIVE
SARS Coronavirus 2 by RT PCR: NEGATIVE

## 2022-05-12 LAB — LIPASE, BLOOD: Lipase: 26 U/L (ref 11–51)

## 2022-05-12 MED ORDER — METOCLOPRAMIDE HCL 5 MG/ML IJ SOLN
10.0000 mg | Freq: Once | INTRAMUSCULAR | Status: AC
Start: 2022-05-12 — End: 2022-05-12
  Administered 2022-05-12: 10 mg via INTRAVENOUS
  Filled 2022-05-12: qty 2

## 2022-05-12 MED ORDER — DIPHENHYDRAMINE HCL 50 MG/ML IJ SOLN
25.0000 mg | Freq: Once | INTRAMUSCULAR | Status: AC
  Administered 2022-05-12: 25 mg via INTRAVENOUS
  Filled 2022-05-12: qty 1

## 2022-05-12 MED ORDER — FAMOTIDINE IN NACL 20-0.9 MG/50ML-% IV SOLN
20.0000 mg | Freq: Once | INTRAVENOUS | Status: AC
  Administered 2022-05-12: 20 mg via INTRAVENOUS
  Filled 2022-05-12: qty 50

## 2022-05-12 MED ORDER — ESOMEPRAZOLE MAGNESIUM 40 MG PO CPDR: 40.0000 mg | DELAYED_RELEASE_CAPSULE | Freq: Every day | ORAL | 0 refills | Status: AC

## 2022-05-12 MED ORDER — PANTOPRAZOLE SODIUM 40 MG IV SOLR
40.0000 mg | Freq: Once | INTRAVENOUS | Status: AC
  Administered 2022-05-12: 40 mg via INTRAVENOUS
  Filled 2022-05-12: qty 10

## 2022-05-12 MED ORDER — GLUCAGON HCL RDNA (DIAGNOSTIC) 1 MG IJ SOLR
1.0000 mg | Freq: Once | INTRAMUSCULAR | Status: AC
  Administered 2022-05-12: 1 mg via INTRAVENOUS
  Filled 2022-05-12: qty 1

## 2022-05-12 MED ORDER — SODIUM CHLORIDE 0.9 % IV BOLUS
1000.0000 mL | Freq: Once | INTRAVENOUS | Status: AC
  Administered 2022-05-12: 1000 mL via INTRAVENOUS

## 2022-05-12 NOTE — ED Notes (Signed)
Ginger ale given to pt.  

## 2022-05-12 NOTE — ED Provider Notes (Signed)
[facility] Provider Note   CSN: [id number] Arrival date & time: 05/12/22  2055     History  Chief Complaint  Patient presents with   Emesis    Douglas Owens is a 62 y.o. male history of hypertension, here presenting with possible food impaction.  Patient states that he was eating some food yesterday and felt that something may have been stuck.  He did eat some pork chop this morning and vomited up.  However he states that he is able to keep down fluids and some crackers.  He states that he has a history of reflux but has not taken any medicine for.  He had a previous endoscopy years ago and was told that he had reflux.  He never had a food impaction previously.  The history is provided by the patient.       Home Medications Prior to Admission medications   Medication Sig Start Date End Date Taking? Authorizing Provider  acetaminophen (TYLENOL) 500 MG tablet Take 1 tablet (500 mg total) by mouth every 6 (six) hours as needed for pain. 06/28/12   Domenic Moras, PA-C  benzonatate (TESSALON) 100 MG capsule Take 1 capsule (100 mg total) by mouth every 8 (eight) hours. 05/20/21   Deno Etienne, DO  cyclobenzaprine (FLEXERIL) 10 MG tablet Take 1 tablet (10 mg total) by mouth 2 (two) times daily as needed for muscle spasms. 06/28/12   Domenic Moras, PA-C  hydrochlorothiazide (HYDRODIURIL) 25 MG tablet Take 25 mg by mouth daily.    [provider]  lidocaine (LIDODERM) 5 % Place 1 patch onto the skin daily. Remove & Discard patch within 12 hours or as directed by MD 03/21/22   Fransico Meadow, MD  ondansetron (ZOFRAN-ODT) 4 MG disintegrating tablet 4mg  ODT q4 hours prn nausea/vomit 05/20/21   Deno Etienne, DO  oxyCODONE-acetaminophen (PERCOCET/ROXICET) 5-325 MG tablet Take 1 tablet by mouth every 6 (six) hours as needed for severe pain. 03/21/22   Fransico Meadow, MD  predniSONE (DELTASONE) 20 MG tablet Take 2 tablets (40 mg total) by mouth daily. Take 40 mg by  mouth daily for 3 days, then 20mg  by mouth daily for 3 days, then 10mg  daily for 3 days 09/19/19   Montine Circle, PA-C      Allergies    Motrin [ibuprofen] and Tramadol    Review of Systems   Review of Systems  Gastrointestinal:  Positive for vomiting.  All other systems reviewed and are negative.   Physical Exam Updated Vital Signs BP (!) 141/78   Pulse 86   Temp 98.7 F (37.1 C) (Oral)   Resp 18   Ht 6\' 2"  (1.88 m)   Wt 104.3 kg   SpO2 96%   BMI 29.53 kg/m  Physical Exam Vitals and nursing note reviewed.  Constitutional:      Appearance: Normal appearance.  HENT:     Head: Normocephalic.     Nose: Nose normal.     Mouth/Throat:     Mouth: Mucous membranes are dry.     Comments: No this foreign body in the posterior pharynx Eyes:     Extraocular Movements: Extraocular movements intact.     Pupils: Pupils are equal, round, and reactive to light.  Cardiovascular:     Rate and Rhythm: Normal rate and regular rhythm.     Pulses: Normal pulses.     Heart sounds: Normal heart sounds.  Pulmonary:     Effort: Pulmonary effort is normal.  Breath sounds: Normal breath sounds.  Abdominal:     General: Abdomen is flat.     Palpations: Abdomen is soft.  Musculoskeletal:        General: Normal range of motion.     Cervical back: Normal range of motion and neck supple.  Skin:    General: Skin is warm.     Capillary Refill: Capillary refill takes less than 2 seconds.  Neurological:     General: No focal deficit present.     Mental Status: He is alert and oriented to person, place, and time.  Psychiatric:        Mood and Affect: Mood normal.        Behavior: Behavior normal.     ED Results / Procedures / Treatments   Labs (all labs ordered are listed, but only abnormal results are displayed) Labs Reviewed  COMPREHENSIVE METABOLIC PANEL - Abnormal; Notable for the following components:      Result Value   Glucose, Bld 126 (*)    Creatinine, Ser 1.29 (*)     All other components within normal limits  CBC - Abnormal; Notable for the following components:   RBC 3.36 (*)    Hemoglobin 10.0 (*)    HCT 31.4 (*)    All other components within normal limits  RESP PANEL BY RT-PCR (RSV, FLU A&B, COVID)  RVPGX2  LIPASE, BLOOD  URINALYSIS, ROUTINE W REFLEX MICROSCOPIC    EKG EKG Interpretation  Date/Time:  Wednesday May 12 2022 21:03:15 EST Ventricular Rate:  83 PR Interval:  165 QRS Duration: 112 QT Interval:  399 QTC Calculation: 469 R Axis:   50 Text Interpretation: Sinus rhythm Incomplete right bundle branch block No previous ECGs available Confirmed by Wandra Arthurs [id number]) on 05/12/2022 9:46:27 PM  Radiology DG Chest 2 View  Result Date: 05/12/2022 CLINICAL DATA:  Possible food impaction. Vomiting for 2 days. Feels like food stuck in the chest area. EXAM: CHEST - 2 VIEW COMPARISON:  05/26/2021 FINDINGS: Normal heart size and pulmonary vascularity. No focal airspace disease or consolidation in the lungs. No blunting of costophrenic angles. No pneumothorax. Mediastinal contours appear intact. Postoperative changes in the cervical spine. No esophageal dilatation or radiopaque foreign bodies identified. IMPRESSION: No active cardiopulmonary disease. Electronically Signed   By: Lucienne Capers M.D.   On: 05/12/2022 22:07    Procedures Procedures    Medications Ordered in ED Medications  sodium chloride 0.9 % bolus 1,000 mL (0 mLs Intravenous Stopped 05/12/22 2259)  famotidine (PEPCID) IVPB 20 mg premix (0 mg Intravenous Stopped 05/12/22 2256)  pantoprazole (PROTONIX) injection 40 mg (40 mg Intravenous Given 05/12/22 2216)  metoCLOPramide (REGLAN) injection 10 mg (10 mg Intravenous Given 05/12/22 2213)  diphenhydrAMINE (BENADRYL) injection 25 mg (25 mg Intravenous Given 05/12/22 2212)  glucagon (human recombinant) (GLUCAGEN) injection 1 mg (1 mg Intravenous Given 05/12/22 2211)    ED Course/ Medical Decision Making/ A&P                              Medical Decision Making Douglas Owens is a 62 y.o. male here presenting with possible food impaction.  Consider food impaction versus reflux.  Patient is tolerating his secretions.  Will try to give glucagon and Reglan and Benadryl and p.o. trial.  Will also get chest x-ray and basic lab work.  11:08 PM I reviewed patient's and independently interpreted chest x-ray.  Labs were unremarkable and COVID and flu  are negative.  Chest x-ray did not show any obvious foreign body.  Patient was given Reglan and Benadryl and glucagon and also was able to tolerate some soda.  He feeling much better now.  I told him to eat soft diet for several days.  Will start patient on PPI and refer to GI.  Problems Addressed: Food impaction of esophagus, initial encounter: acute illness or injury  Amount and/or Complexity of Data Reviewed Labs: ordered. Decision-making details documented in ED Course. Radiology: ordered and independent interpretation performed. Decision-making details documented in ED Course.  Risk Prescription drug management.    Final Clinical Impression(s) / ED Diagnoses Final diagnoses:  None    Rx / DC Orders ED Discharge Orders     None         Charlynne Pander, MD 05/12/22 2309

## 2022-05-12 NOTE — ED Notes (Signed)
Tolerated ginger ale well. No nausea or vomiting

## 2022-05-12 NOTE — ED Triage Notes (Signed)
Pt vomiting x 2days; sts feels like food gets stuck in chest area

## 2022-05-12 NOTE — Discharge Instructions (Signed)
As we discussed, you likely have reflux.  I recommend taking Nexium 40 mg daily.  For the next couple days, I recommend that you eat soft diet and avoid large pieces of meat or chips.  You need to call GI tomorrow to get follow-up for endoscopy  Return to ER if you have trouble swallowing, food stuck in your throat, vomiting

## 2022-05-12 NOTE — ED Notes (Signed)
Pt states he has been vomiting x 2 days.  States if he eats anything he vomits.  Feels like something is stuck in his chest.  Pt does have a hx of reflux and has not been on any meds for this in years

## 2022-07-30 ENCOUNTER — Emergency Department (HOSPITAL_BASED_OUTPATIENT_CLINIC_OR_DEPARTMENT_OTHER)
Admission: EM | Admit: 2022-07-30 | Discharge: 2022-07-30 | Disposition: A | Discharge status: Home / Self Care | Payer: Medicare Other | Attending: Emergency Medicine | Admitting: Emergency Medicine

## 2022-07-30 ENCOUNTER — Encounter (HOSPITAL_BASED_OUTPATIENT_CLINIC_OR_DEPARTMENT_OTHER): Payer: Self-pay

## 2022-07-30 ENCOUNTER — Other Ambulatory Visit: Payer: Self-pay

## 2022-07-30 ENCOUNTER — Emergency Department (HOSPITAL_BASED_OUTPATIENT_CLINIC_OR_DEPARTMENT_OTHER): Payer: Medicare Other

## 2022-07-30 DIAGNOSIS — I10 Essential (primary) hypertension: Secondary | ICD-10-CM | POA: Insufficient documentation

## 2022-07-30 DIAGNOSIS — R109 Unspecified abdominal pain: Secondary | ICD-10-CM | POA: Diagnosis not present

## 2022-07-30 DIAGNOSIS — E119 Type 2 diabetes mellitus without complications: Secondary | ICD-10-CM | POA: Insufficient documentation

## 2022-07-30 LAB — CBC WITH DIFFERENTIAL/PLATELET
Abs Immature Granulocytes: 0.02 10*3/uL (ref 0.00–0.07)
Basophils Absolute: 0 10*3/uL (ref 0.0–0.1)
Basophils Relative: 1 %
Eosinophils Absolute: 0.2 10*3/uL (ref 0.0–0.5)
Eosinophils Relative: 4 %
HCT: 34.6 % — ABNORMAL LOW (ref 39.0–52.0)
Hemoglobin: 11.2 g/dL — ABNORMAL LOW (ref 13.0–17.0)
Immature Granulocytes: 0 %
Lymphocytes Relative: 29 %
Lymphs Abs: 1.7 10*3/uL (ref 0.7–4.0)
MCH: 29.5 pg (ref 26.0–34.0)
MCHC: 32.4 g/dL (ref 30.0–36.0)
MCV: 91.1 fL (ref 80.0–100.0)
Monocytes Absolute: 0.4 10*3/uL (ref 0.1–1.0)
Monocytes Relative: 7 %
Neutro Abs: 3.4 10*3/uL (ref 1.7–7.7)
Neutrophils Relative %: 59 %
Platelets: 307 10*3/uL (ref 150–400)
RBC: 3.8 MIL/uL — ABNORMAL LOW (ref 4.22–5.81)
RDW: 13.4 % (ref 11.5–15.5)
WBC: 5.8 10*3/uL (ref 4.0–10.5)
nRBC: 0 % (ref 0.0–0.2)

## 2022-07-30 LAB — URINALYSIS, ROUTINE W REFLEX MICROSCOPIC
Bilirubin Urine: NEGATIVE
Glucose, UA: NEGATIVE mg/dL
Hgb urine dipstick: NEGATIVE
Ketones, ur: NEGATIVE mg/dL
Leukocytes,Ua: NEGATIVE
Nitrite: NEGATIVE
Protein, ur: NEGATIVE mg/dL
Specific Gravity, Urine: 1.02 (ref 1.005–1.030)
pH: 5.5 (ref 5.0–8.0)

## 2022-07-30 LAB — COMPREHENSIVE METABOLIC PANEL
ALT: 18 U/L (ref 0–44)
AST: 22 U/L (ref 15–41)
Albumin: 4.1 g/dL (ref 3.5–5.0)
Alkaline Phosphatase: 87 U/L (ref 38–126)
Anion gap: 10 (ref 5–15)
BUN: 12 mg/dL (ref 8–23)
CO2: 26 mmol/L (ref 22–32)
Calcium: 9.1 mg/dL (ref 8.9–10.3)
Chloride: 100 mmol/L (ref 98–111)
Creatinine, Ser: 1.17 mg/dL (ref 0.61–1.24)
GFR, Estimated: >60 mL/min (ref >60)
Glucose, Bld: 128 mg/dL — ABNORMAL HIGH (ref 70–99)
Potassium: 3.6 mmol/L (ref 3.5–5.1)
Sodium: 136 mmol/L (ref 135–145)
Total Bilirubin: 0.3 mg/dL (ref 0.3–1.2)
Total Protein: 8 g/dL (ref 6.5–8.1)

## 2022-07-30 MED ORDER — KETOROLAC TROMETHAMINE 30 MG/ML IJ SOLN
30.0000 mg | Freq: Once | INTRAMUSCULAR | Status: AC
  Administered 2022-07-30: 30 mg via INTRAMUSCULAR
  Filled 2022-07-30: qty 1

## 2022-07-30 MED ORDER — KETOROLAC TROMETHAMINE 15 MG/ML IJ SOLN: 30.0000 mg | Freq: Once | INTRAMUSCULAR | Status: DC

## 2022-07-30 MED ORDER — KETOROLAC TROMETHAMINE 10 MG PO TABS: 10.0000 mg | ORAL_TABLET | Freq: Four times a day (QID) | ORAL | 0 refills | Status: AC | PRN

## 2022-07-30 MED ORDER — METHOCARBAMOL 500 MG PO TABS: 500.0000 mg | ORAL_TABLET | Freq: Three times a day (TID) | ORAL | 0 refills | Status: AC | PRN

## 2022-07-30 NOTE — Discharge Instructions (Signed)
You were evaluated in the Emergency Department and after careful evaluation, we did not find any emergent condition requiring admission or further testing in the hospital.  Your exam/testing today is overall reassuring.  Symptoms may be due to a muscle strain or spasm.  Use the ketorolac anti-inflammatory as needed for pain.  Use the Robaxin muscle relaxer for more significant pain keeping you from sleeping.  Use caution as the muscle relaxer can cause drowsiness.  Please return to the Emergency Department if you experience any worsening of your condition.   Thank you for allowing [place] to be a part of your care.

## 2022-07-30 NOTE — ED Notes (Signed)
Pt. Reports lower R flank pain since yesterday.  No trouble urinating and no visible blood in his urine.  Pt. Does reports he has had history of Kidney stones he has not passed.

## 2022-07-30 NOTE — ED Triage Notes (Signed)
Right sided flank pain beginning yesterday. Says he has known stones in the kidney. Intermittent painful urination.

## 2022-07-30 NOTE — ED Provider Notes (Signed)
[facility] MRN:  [id number]  Arrival date & time: 07/30/22     Chief Complaint   Flank Pain   History of Present Illness   Douglas Lorn JunesMerritt is a 62 y.o. year-old male with a history of hypertension, diabetes, kidney stones presenting to the ED with chief complaint of flank pain.  Right flank pain with radiation down to the right lower quadrant, sudden onset earlier today, constant.  Worse with certain movements and positions.  No nausea vomiting or diarrhea.  No fever.  No bowel or bladder dysfunction, no numbness or weakness.  Review of Systems  A thorough review of systems was obtained and all systems are negative except as noted in the HPI and PMH.   Patient's Health History    Past Medical History:  Diagnosis Date   Chronic back pain    Diabetes mellitus without complication    Hypertension     Past Surgical History:  Procedure Laterality Date   BACK SURGERY     UMBILICAL HERNIA REPAIR      History reviewed. No pertinent family history.  Social History   Socioeconomic History   Marital status: Married    Spouse name: Not on file   Number of children: Not on file   Years of education: Not on file   Highest education level: Not on file  Occupational History   Not on file  Tobacco Use   Smoking status: Former    Types: Cigarettes    Quit date: 02/20/1992    Years since quitting: 30.4   Smokeless tobacco: Not on file  Substance and Sexual Activity   Alcohol use: No   Drug use: No   Sexual activity: Not on file  Other Topics Concern   Not on file  Social History Narrative   Not on file   Social Determinants of Health   Financial Resource Strain: Not on file  Food Insecurity: Not on file  Transportation Needs: Not on file  Physical Activity: Not on file  Stress: Not on file  Social Connections: Not on file  Intimate Partner Violence: Not on file     Physical Exam   Vitals:   07/30/22 2037   BP: (!) 164/89  Pulse: 77  Resp: 18  Temp: 98.2 F (36.8 C)  SpO2: 97%    CONSTITUTIONAL: Well-appearing, NAD NEURO/PSYCH:  Alert and oriented x 3, no focal deficits EYES:  eyes equal and reactive ENT/NECK:  no LAD, no JVD CARDIO: Regular rate, well-perfused, normal S1 and S2 PULM:  CTAB no wheezing or rhonchi GI/GU:  non-distended, non-tender MSK/SPINE:  No gross deformities, no edema SKIN:  no rash, atraumatic   *Additional and/or pertinent findings included in MDM below  Diagnostic and Interventional Summary    EKG Interpretation  Date/Time:    Ventricular Rate:    PR Interval:    QRS Duration:   QT Interval:    QTC Calculation:   R Axis:     Text Interpretation:         Labs Reviewed  CBC WITH DIFFERENTIAL/PLATELET - Abnormal; Notable for the following components:      Result Value   RBC 3.80 (*)    Hemoglobin 11.2 (*)    HCT 34.6 (*)    All other components within normal limits  COMPREHENSIVE METABOLIC PANEL - Abnormal; Notable for the following components:   Glucose, Bld 128 (*)    All other components within normal limits  URINALYSIS, ROUTINE W  REFLEX MICROSCOPIC    CT Renal Stone Study  Final Result      Medications  ketorolac (TORADOL) 15 MG/ML injection 30 mg (has no administration in time range)     Procedures  /  Critical Care Procedures  ED Course and Medical Decision Making  Initial Impression and Ddx Differential diagnosis includes kidney stone, appendicitis, diverticulitis, pyelonephritis, MSK.  Past medical/surgical history that increases complexity of ED encounter: History of kidney stones, hypertension, diabetes  Interpretation of Diagnostics I personally reviewed the laboratory assessment and my interpretation is as follows: No significant blood count or electrolyte disturbance, urinalysis normal  CT imaging is without evidence of obstructive kidney stone causing pain.  Appendix appears normal, normal caliber aorta.  Patient  Reassessment and Ultimate Disposition/Management     The area is tender to palpation, there is no rash, nothing to suggest shingles at this time.  Has been roughhousing with grandson recently.  Suspect MSK.  No emergent process, appropriate for discharge.  Patient management required discussion with the following services or consulting groups:  None  Complexity of Problems Addressed Acute illness or injury that poses threat of life of bodily function  Additional Data Reviewed and Analyzed Further history obtained from: Further history from spouse/family member  Additional Factors Impacting ED Encounter Risk Prescriptions  Elmer Sow. Pilar Plate, MD [facility] Emergency Medicine [facility] [email or web address]  Final Clinical Impressions(s) / ED Diagnoses     ICD-10-CM   1. Flank pain  R10.9       ED Discharge Orders          Ordered    methocarbamol (ROBAXIN) 500 MG tablet  Every 8 hours PRN        07/30/22 2320    ketorolac (TORADOL) 10 MG tablet  Every 6 hours PRN        07/30/22 2320             Discharge Instructions Discussed with and Provided to Patient:    Discharge Instructions      You were evaluated in the Emergency Department and after careful evaluation, we did not find any emergent condition requiring admission or further testing in the hospital.  Your exam/testing today is overall reassuring.  Symptoms may be due to a muscle strain or spasm.  Use the ketorolac anti-inflammatory as needed for pain.  Use the Robaxin muscle relaxer for more significant pain keeping you from sleeping.  Use caution as the muscle relaxer can cause drowsiness.  Please return to the Emergency Department if you experience any worsening of your condition.   Thank you for allowing [place] to be a part of your care.      Sabas Sous, MD 07/30/22 [phone]
